# Patient Record
Sex: Female | Born: 1955 | Race: White | Hispanic: No | Marital: Married | Smoking: Former smoker
Health system: Southern US, Community
[De-identification: ages and names within clinical notes are randomized; demographics above are authoritative.]

## PROBLEM LIST (undated history)

## (undated) DIAGNOSIS — F32A Depression, unspecified: Secondary | ICD-10-CM

## (undated) DIAGNOSIS — F419 Anxiety disorder, unspecified: Secondary | ICD-10-CM

## (undated) DIAGNOSIS — I10 Essential (primary) hypertension: Secondary | ICD-10-CM

## (undated) DIAGNOSIS — E559 Vitamin D deficiency, unspecified: Secondary | ICD-10-CM

## (undated) DIAGNOSIS — R7989 Other specified abnormal findings of blood chemistry: Secondary | ICD-10-CM

## (undated) DIAGNOSIS — F329 Major depressive disorder, single episode, unspecified: Secondary | ICD-10-CM

## (undated) DIAGNOSIS — R945 Abnormal results of liver function studies: Secondary | ICD-10-CM

## (undated) DIAGNOSIS — E119 Type 2 diabetes mellitus without complications: Secondary | ICD-10-CM

## (undated) DIAGNOSIS — E785 Hyperlipidemia, unspecified: Secondary | ICD-10-CM

## (undated) DIAGNOSIS — D414 Neoplasm of uncertain behavior of bladder: Secondary | ICD-10-CM

## (undated) HISTORY — DX: Depression, unspecified: F32.A

## (undated) HISTORY — DX: Essential (primary) hypertension: I10

## (undated) HISTORY — DX: Type 2 diabetes mellitus without complications: E11.9

## (undated) HISTORY — DX: Vitamin D deficiency, unspecified: E55.9

## (undated) HISTORY — DX: Abnormal results of liver function studies: R94.5

## (undated) HISTORY — DX: Major depressive disorder, single episode, unspecified: F32.9

## (undated) HISTORY — DX: Anxiety disorder, unspecified: F41.9

## (undated) HISTORY — DX: Hyperlipidemia, unspecified: E78.5

## (undated) HISTORY — DX: Neoplasm of uncertain behavior of bladder: D41.4

## (undated) HISTORY — DX: Other specified abnormal findings of blood chemistry: R79.89

---

## 2000-12-09 ENCOUNTER — Other Ambulatory Visit: Admission: RE | Admit: 2000-12-09 | Discharge: 2000-12-09 | Payer: Self-pay | Admitting: Gynecology

## 2004-05-23 ENCOUNTER — Other Ambulatory Visit: Admission: RE | Admit: 2004-05-23 | Discharge: 2004-05-23 | Payer: Self-pay | Admitting: Gynecology

## 2007-05-31 ENCOUNTER — Other Ambulatory Visit: Admission: RE | Admit: 2007-05-31 | Discharge: 2007-05-31 | Payer: Self-pay | Admitting: Gynecology

## 2009-02-16 ENCOUNTER — Ambulatory Visit: Payer: Self-pay | Admitting: Gynecology

## 2009-02-16 ENCOUNTER — Other Ambulatory Visit: Admission: RE | Admit: 2009-02-16 | Discharge: 2009-02-16 | Payer: Self-pay | Admitting: Gynecology

## 2009-02-16 ENCOUNTER — Encounter: Payer: Self-pay | Admitting: Gynecology

## 2013-01-31 ENCOUNTER — Other Ambulatory Visit: Payer: Self-pay | Admitting: Family Medicine

## 2013-01-31 DIAGNOSIS — R7989 Other specified abnormal findings of blood chemistry: Secondary | ICD-10-CM

## 2013-02-10 ENCOUNTER — Other Ambulatory Visit: Payer: Self-pay

## 2013-02-17 ENCOUNTER — Ambulatory Visit
Admission: RE | Admit: 2013-02-17 | Discharge: 2013-02-17 | Disposition: A | Discharge status: Home / Self Care | Payer: 59 | Source: Ambulatory Visit | Attending: Family Medicine | Admitting: Family Medicine

## 2013-02-17 DIAGNOSIS — R7989 Other specified abnormal findings of blood chemistry: Secondary | ICD-10-CM

## 2015-01-06 ENCOUNTER — Encounter: Payer: Self-pay | Admitting: *Deleted

## 2015-02-13 IMAGING — US US ABDOMEN COMPLETE
1 series · 13 of 25 positions shown · non-contrast
Comparison: None.

CLINICAL DATA: Elevated liver function tests

COMPLETE ABDOMINAL ULTRASOUND

[Series 1: us abdomen complete · 0.35mm/px · 13 of 83 slices shown]
[im 1/83]
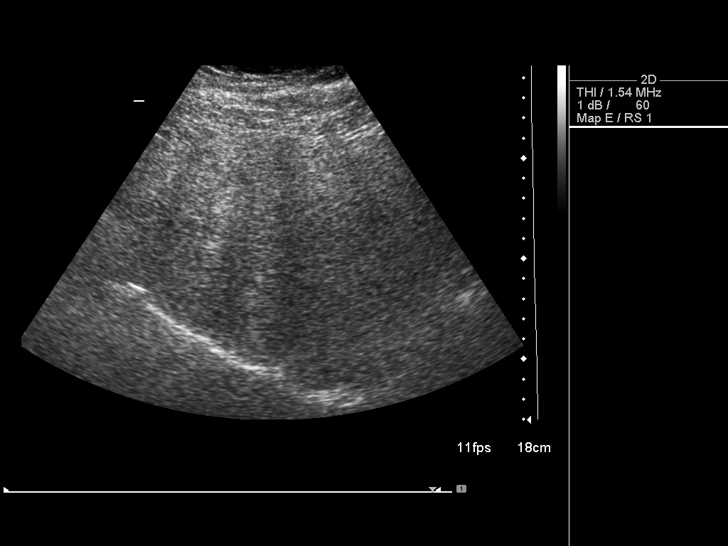
[im 7/83]
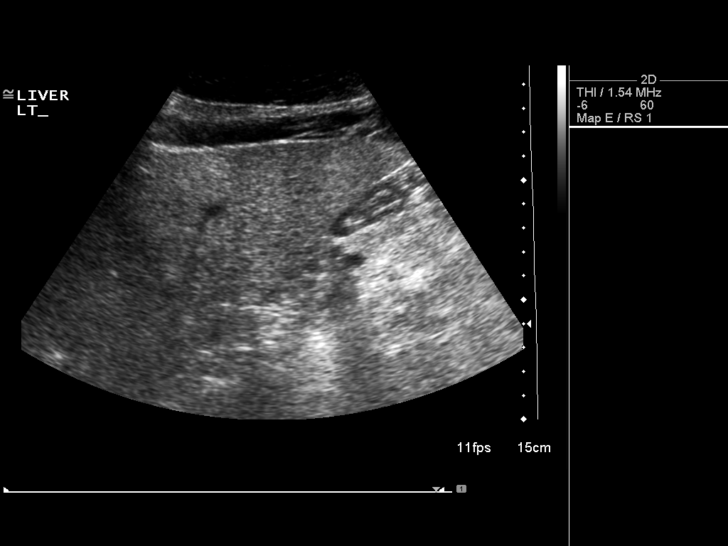
[im 14/83]
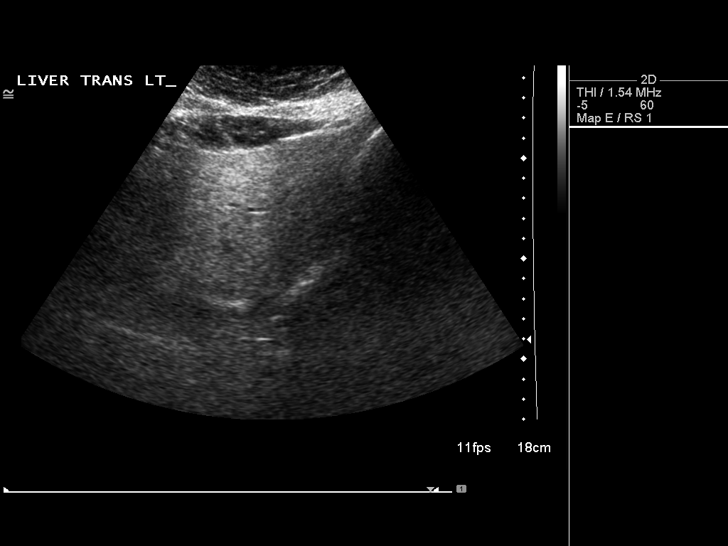
[im 21/83]
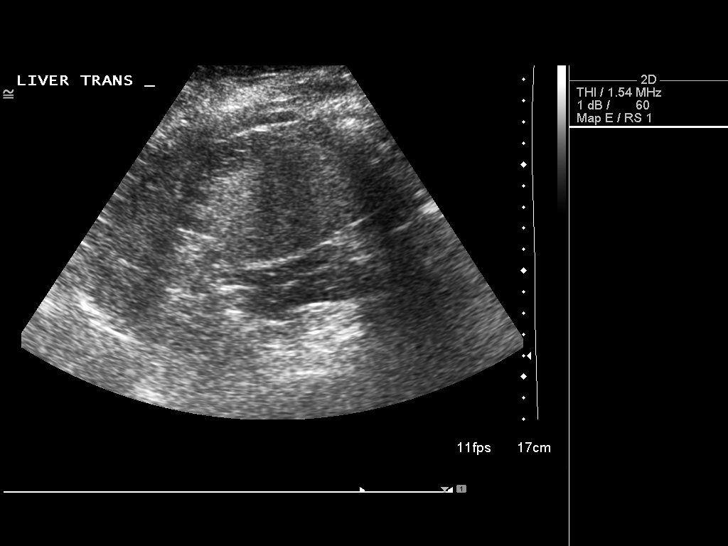
[im 28/83]
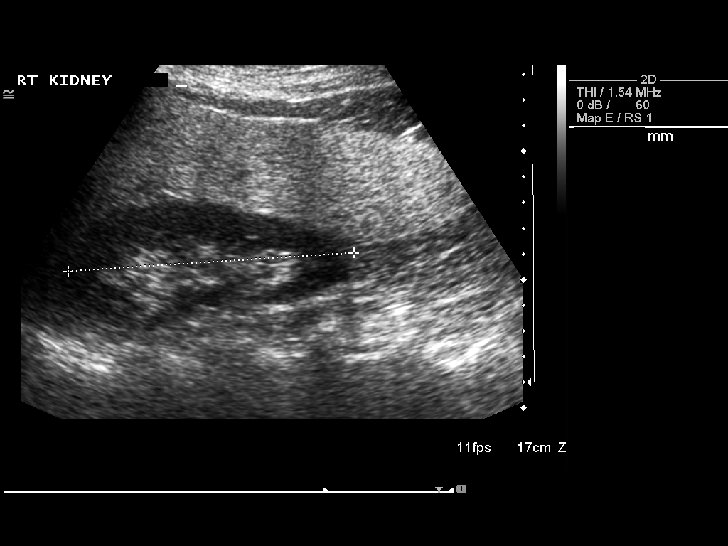
[im 35/83]
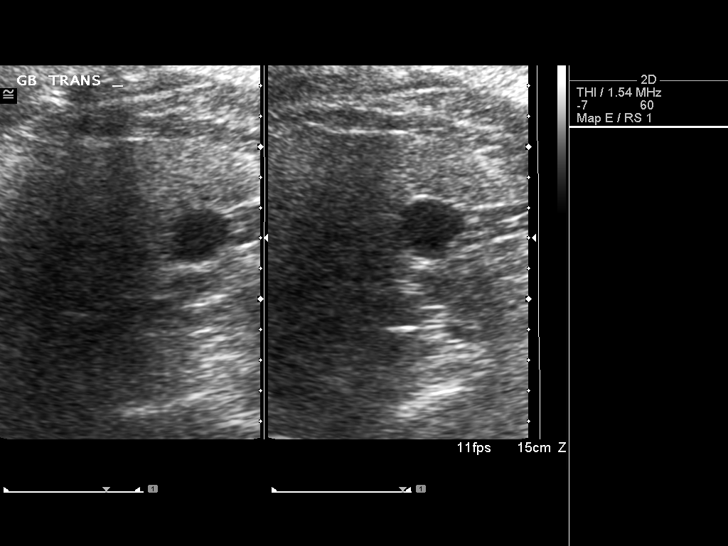
[im 42/83]
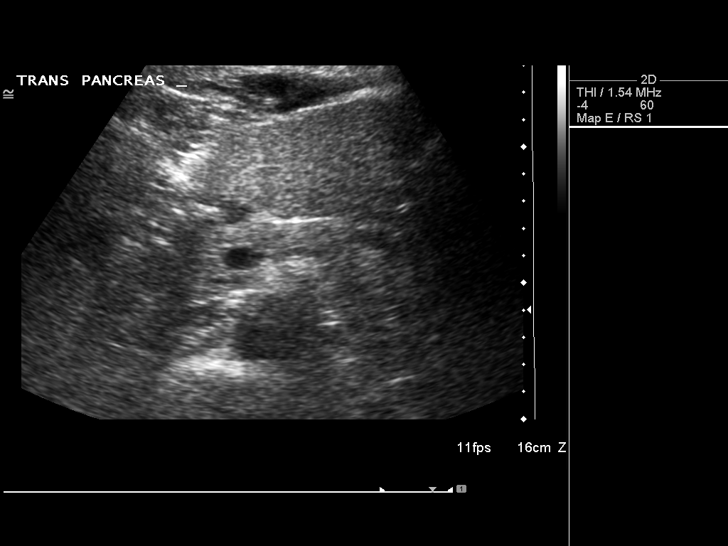
[im 48/83]
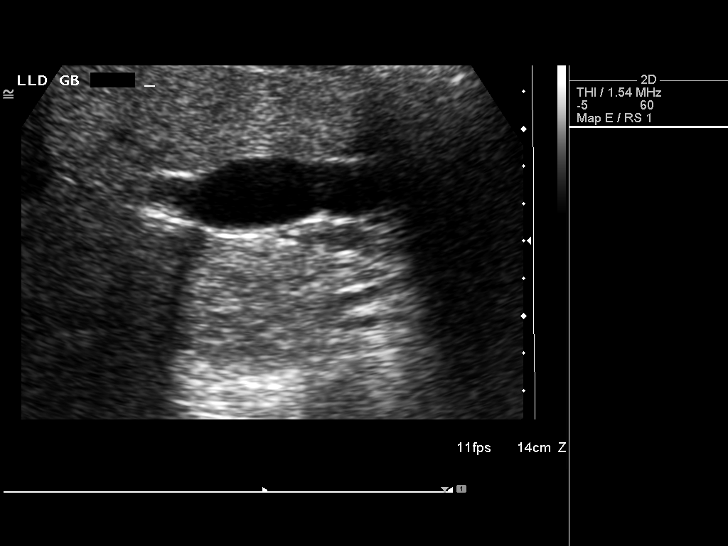
[im 55/83]
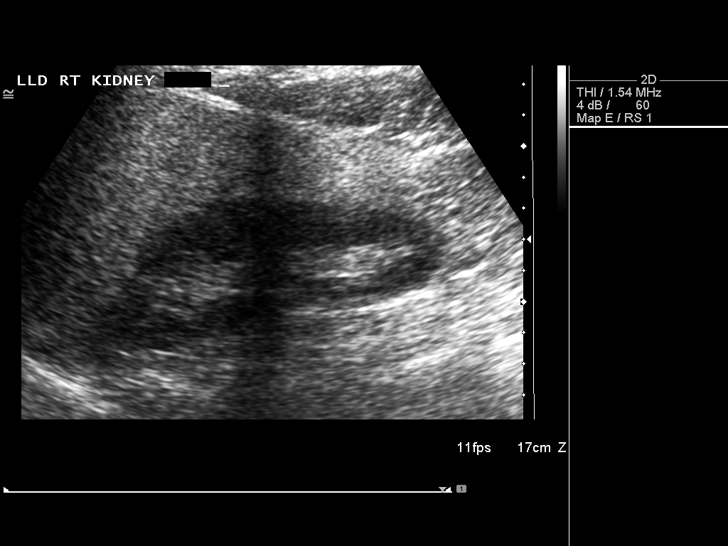
[im 62/83]
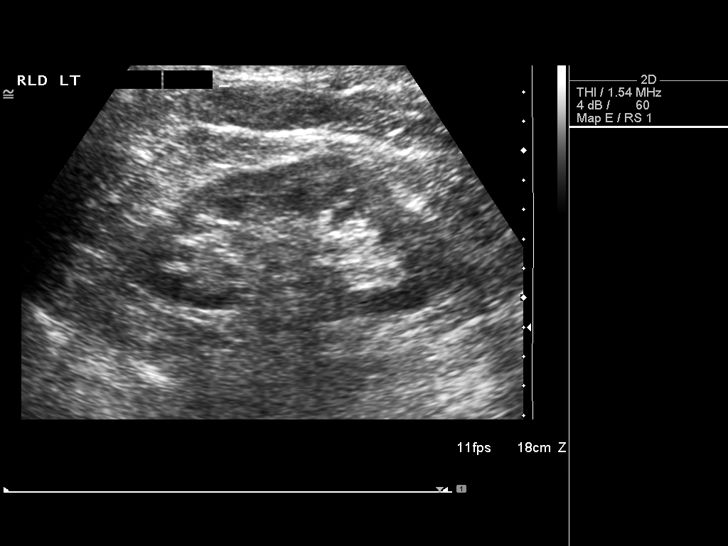
[im 69/83]
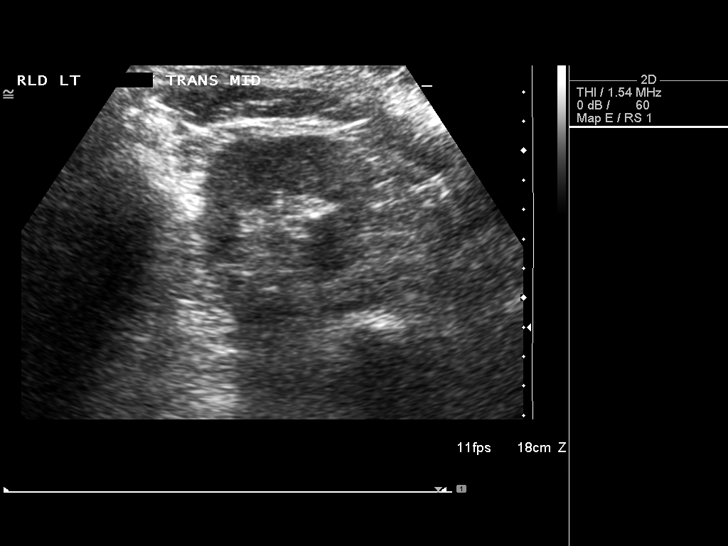
[im 76/83]
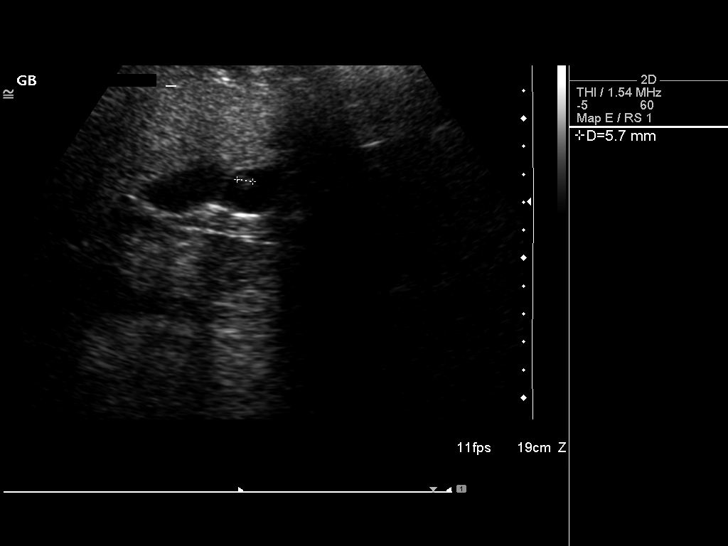
[im 83/83]
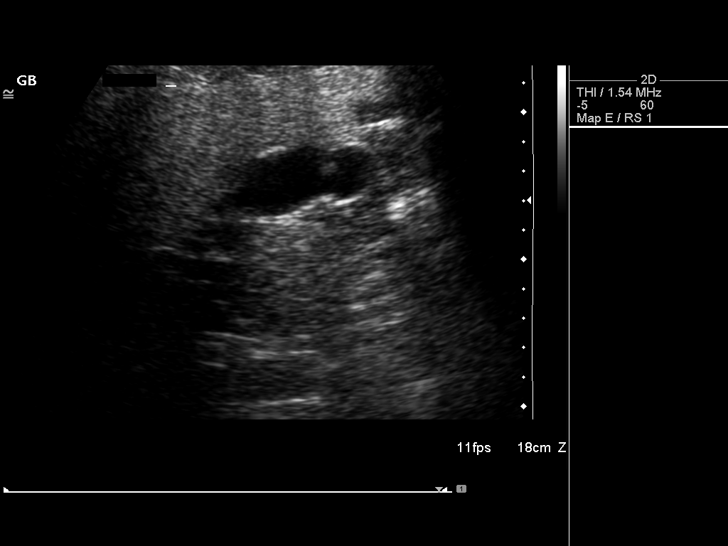

[13 of 25 positions shown; findings below may reference images not displayed]

FINDINGS: Gallbladder:  The gallbladder is slightly contracted and there are
a small polyps present of no more than 6 mm in diameter.  No
gallstones are seen.  There is slight tenderness over the right
upper quadrant with compression.

Common bile duct:  The common bile duct is within upper limits of
normal measuring 6.6 mm.

Liver:  The liver is echogenic inhomogeneous consistent with fatty
infiltration.  A probable Delfin lobe is noted extending
caudally.

IVC:  Appears normal.

Pancreas:  No focal abnormality seen.

Spleen:  The spleen is normal measuring 5.6 cm sagittally.

Right Kidney:  No hydronephrosis is seen.  The right kidney
measures 12.0 cm sagittally.

Left Kidney:  No hydronephrosis is noted.  The left kidney measures
12.0 cm.

Abdominal aorta:  The abdominal aorta is normal in caliber.

Portions of this exam are limited due to bowel gas.
IMPRESSION: 1.  Gallbladder polyps.  No definite gallstones.  However there is
tenderness over the gallbladder with compression and clinical
correlation is recommended.
2.  Slightly prominent common bile duct.
3.  Fatty infiltration of the liver.

## 2015-05-07 ENCOUNTER — Other Ambulatory Visit: Payer: Self-pay

## 2015-05-07 ENCOUNTER — Other Ambulatory Visit: Payer: Self-pay | Admitting: Family Medicine

## 2015-05-07 DIAGNOSIS — Z1231 Encounter for screening mammogram for malignant neoplasm of breast: Secondary | ICD-10-CM

## 2015-05-07 DIAGNOSIS — R0989 Other specified symptoms and signs involving the circulatory and respiratory systems: Secondary | ICD-10-CM

## 2015-05-10 ENCOUNTER — Ambulatory Visit
Admission: RE | Admit: 2015-05-10 | Discharge: 2015-05-10 | Disposition: A | Discharge status: Home / Self Care | Payer: 59 | Source: Ambulatory Visit

## 2015-05-10 ENCOUNTER — Ambulatory Visit
Admission: RE | Admit: 2015-05-10 | Discharge: 2015-05-10 | Disposition: A | Discharge status: Home / Self Care | Payer: 59 | Source: Ambulatory Visit | Attending: Family Medicine | Admitting: Family Medicine

## 2015-05-10 DIAGNOSIS — Z1231 Encounter for screening mammogram for malignant neoplasm of breast: Secondary | ICD-10-CM

## 2015-05-10 DIAGNOSIS — R0989 Other specified symptoms and signs involving the circulatory and respiratory systems: Secondary | ICD-10-CM

## 2015-05-14 ENCOUNTER — Other Ambulatory Visit: Payer: Self-pay | Admitting: Family Medicine

## 2015-05-14 DIAGNOSIS — R928 Other abnormal and inconclusive findings on diagnostic imaging of breast: Secondary | ICD-10-CM

## 2015-05-22 ENCOUNTER — Ambulatory Visit
Admission: RE | Admit: 2015-05-22 | Discharge: 2015-05-22 | Disposition: A | Discharge status: Home / Self Care | Payer: 59 | Source: Ambulatory Visit | Attending: Family Medicine | Admitting: Family Medicine

## 2015-05-22 DIAGNOSIS — R928 Other abnormal and inconclusive findings on diagnostic imaging of breast: Secondary | ICD-10-CM

## 2015-05-29 ENCOUNTER — Encounter: Payer: Self-pay | Admitting: *Deleted

## 2015-05-29 ENCOUNTER — Encounter: Payer: 59 | Attending: Family Medicine | Admitting: *Deleted

## 2015-05-29 VITALS — Ht 63.0 in | Wt 204.0 lb

## 2015-05-29 DIAGNOSIS — Z713 Dietary counseling and surveillance: Secondary | ICD-10-CM | POA: Insufficient documentation

## 2015-05-29 DIAGNOSIS — E119 Type 2 diabetes mellitus without complications: Secondary | ICD-10-CM | POA: Diagnosis present

## 2015-05-29 NOTE — Progress Notes (Signed)
Diabetes Self-Management Education  Visit Type: First/Initial (DX:04/2014)  Appt. Start Time: 0800 Appt. End Time: 0930  05/29/2015  [name], identified by name and date of birth, is a 59 y.o. female with a diagnosis of Diabetes: Type 2.  Other people present during visit:  Patient presents with HX of T2DM onset 04/2014 with an A1c of 8.1%. She has been attempting to manage with nutrition and activity and was able to reduce her A1c to 6.9%. Today we have an A1c of 10.9%. Her father, sister and husband also have diabetes. 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 If view of the complicates experienced by her father she is very motivated to manage her glucose. She is presently symptomatic with; polyuria, polydypsia, fatigue and impaired vision.  ASSESSMENT  Height 5\' 3"  (1.6 m), weight 204 lb (92.534 kg). Body mass index is 36.15 kg/(m^2).  Initial Visit Information:  Are you currently following a meal plan?: Yes What type of meal plan do you follow?: low carbohydrates Are you taking your medications as prescribed?: Yes  How often do you need to have someone help you when you read instructions, pamphlets, or other written materials from your doctor or pharmacy?: 1 - Never   Psychosocial:   Patient Belief/Attitude about Diabetes: Motivated to manage diabetes Self-care barriers: None Self-management support: Doctor's office, CDE visits Other persons present: Patient Patient Concerns: Nutrition/Meal planning, Monitoring, Healthy Lifestyle, Glycemic Control, Weight Control Special Needs: None Preferred Learning Style: No preference indicated Learning Readiness: Change in progress  Complications:   Last HgB A1C per patient/outside source: 10.9 mg/dL How often do you check your blood sugar?: 1-2 times/day Fasting Blood glucose range (mg/dL): >200 Postprandial Blood glucose range (mg/dL): >200 (305mg /dl 2hpp this date) Have you had a dilated eye exam  in the past 12 months?: No Have you had a dental exam in the past 12 months?: Yes  Exercise:  Exercise: ADL's  Individualized Plan for Diabetes Self-Management Training:   Learning Objective:  Patient will have a greater understanding of diabetes self-management. Patient education plan per assessed needs and concerns is to attend individual sessions     Education Topics Reviewed with Patient Today:  Definition of diabetes, type 1 and 2, and the diagnosis of diabetes, Factors that contribute to the development of diabetes Role of diet in the treatment of diabetes and the relationship between the three main macronutrients and blood glucose level, Food label reading, portion sizes and measuring food., Carbohydrate counting, Reviewed blood glucose goals for pre and post meals and how to evaluate the patients' food intake on their blood glucose level., Effects of alcohol on blood glucose and safety factors with consumption of alcohol., Meal options for control of blood glucose level and chronic complications. Role of exercise on diabetes management, blood pressure control and cardiac health., Helped patient identify appropriate exercises in relation to his/her diabetes, diabetes complications and other health issue. Reviewed patients medication for diabetes, action, purpose, timing of dose and side effects. Purpose and frequency of SMBG. Relationship between chronic complications and blood glucose control, Assessed and discussed foot care and prevention of foot problems, Dental care, Lipid levels, blood glucose control and heart disease, Retinopathy and reason for yearly dilated eye exams, Identified and discussed with patient  current chronic complications, Reviewed with patient heart disease, higher risk of, and prevention Role of stress on diabetes   PATIENTS GOALS/Plan (Developed by the patient):  Nutrition: General guidelines for healthy choices and portions discussed Physical  Activity:  Exercise 5-7 days per week, 30 minutes per day Medications: take my medication as prescribed Monitoring : test my blood glucose as discussed (note x per day with comment) (FBS & 2hpp any meal) Reducing Risk: do foot checks daily, get labs drawn   Patient Instructions  Plan:  Aim for 2-3 Carb Choices per meal (30-45 grams) +/- 1 either way  Aim for 0-15 Carbs per snack if hungry  Include protein in moderation with your meals and snacks Consider reading food labels for Total Carbohydrate and Fat Grams of foods Consider  increasing your activity level by walking for 30 minutes daily as tolerated Consider checking BG at alternate times per day to include fasting ( before eating or drinking anything in the morning & 2 hours after any meal) as directed by MD  Continue taking medication as directed by MD  Jenny Maldonado & [employer] reduced calore 29/51 calories per slice ( 8-84Z) [employer] [place] Yogurt Yoplait 100 Greek Yogurt [employer] 000 (black)  [facility] [employer]  Consider [employer] Glucometer and testing products for lesser cost!    Expected Outcomes:  Demonstrated interest in learning. Expect positive outcomes  Education material provided: Living Well with Diabetes, Food label handouts, A1C conversion sheet, Meal plan card, My Plate and Snack sheet  If problems or questions, patient to contact team via:  Phone  Future DSME appointment: 4-6 wks

## 2015-05-29 NOTE — Patient Instructions (Signed)
Plan:  Aim for 2-3 Carb Choices per meal (30-45 grams) +/- 1 either way  Aim for 0-15 Carbs per snack if hungry  Include protein in moderation with your meals and snacks Consider reading food labels for Total Carbohydrate and Fat Grams of foods Consider  increasing your activity level by walking for 30 minutes daily as tolerated Consider checking BG at alternate times per day to include fasting ( before eating or drinking anything in the morning & 2 hours after any meal) as directed by MD  Continue taking medication as directed by MD  [name] & [employer] reduced calore 33/38 calories per slice ( 3-29V) [employer] Fit [place] Yogurt Yoplait 100 Greek Yogurt [employer] 000 (black)  [facility] [employer] and testing products for lesser cost!

## 2015-07-10 ENCOUNTER — Telehealth: Payer: Self-pay | Admitting: *Deleted

## 2015-07-10 ENCOUNTER — Encounter: Payer: Self-pay | Admitting: *Deleted

## 2015-07-10 ENCOUNTER — Encounter: Payer: 59 | Attending: Family Medicine | Admitting: *Deleted

## 2015-07-10 VITALS — Ht 63.0 in | Wt 201.0 lb

## 2015-07-10 DIAGNOSIS — Z713 Dietary counseling and surveillance: Secondary | ICD-10-CM | POA: Diagnosis not present

## 2015-07-10 DIAGNOSIS — E119 Type 2 diabetes mellitus without complications: Secondary | ICD-10-CM | POA: Insufficient documentation

## 2015-07-10 NOTE — Patient Instructions (Signed)
Continue making the good food choices Continue exercising portion control Work some exercise into your daily activity. Goal of 150 mniutes per week (30 minutes 5Xweek) Gradually decrease wine to 5 oz per night. Set a specific bed time and try to stick to it  Follow up with Dr. Drema Dallas with Metformin, Lab tests for August and f/u appointment

## 2015-07-10 NOTE — Progress Notes (Signed)
Diabetes Self-Management Education  Visit Type:  Follow-up  Appt. Start Time: 0900 Appt. End Time: 1000  07/10/2015  [name], identified by name and date of birth, is a 59 y.o. female with a diagnosis of Diabetes: Type 2.  Other people present during visit:  Patient only. Jenny Maldonado is making good food choices and exercising portion control. Her glucose number have come down but she continues to have opportunity for improvement. She had been instructed by Dr. Drema Dallas to call her with glucose numbers but had not done so yet. I contacted office and left msg with FBS & 2hpp ranges as well as 7d-14d- & 30d averages. Questioned if it would be appropriate to increase Metformin at this time. Patient contact information provided for call back. Patient has only lost 3# in the past 6 weeks. This is frustrating to her. She admits that she has not embraced the exercise. She has a treadmill but has not gotten on it. She is presently drinking 1-3 glasses of red wine per evening and not getting enough sleep. She recognizes this as a challenge.  ASSESSMENT  Height 5\' 3"  (1.6 m), weight 201 lb (91.173 kg). Body mass index is 35.61 kg/(m^2).   Subsequent Visit Information:  Since your last visit, have you continued or began the use of a meal plan?: Yes Since your last visit, have you continued or began to exercise on a consistent basis?: No Since your last visit have you continued or begun to take your medications as prescribed?: Yes Since your last visit have you had your blood pressure checked?: Yes Is your most recent blood pressure lower, unchanged, or higher since your last visit?: Higher (monitors regularly, on medication for management) Since your last visit have you experienced any weight changes?: Loss Weight Loss (lbs): 3 Since your last visit, are you checking your blood glucose at least once a day?: Yes  Psychosocial:   Patient Belief/Attitude about Diabetes: Motivated to manage  diabetes Self-care barriers: None Self-management support: Jenny Maldonado office, Family, CDE visits Other persons present: Patient Patient Concerns: Healthy Lifestyle, Weight Control, Glycemic Control Special Needs: None Preferred Learning Style: No preference indicated Learning Readiness: Change in progress  Complications:   How often do you check your blood sugar?: 1-2 times/day Fasting Blood glucose range (mg/dL): 130-179, >200 (154-210 mg/dl) Postprandial Blood glucose range (mg/dL): 70-129, >200 (118-228mg /dl) Have you had a dilated eye exam in the past 12 months?: Yes  Diet Intake:  Breakfast: cottage cheese, blueberries, hot green tea, / egg, 2 bacon, 1 bread, 4 oz v8 juice/ Yoplait 100 Greek , coffee Lunch: salad, romaine lettuce, tomatoe, cucumber, olive, (oil & vinegar) ----add carb & protein           tuna salad /  Beverage(s): No longer drinking Diet Coke, now drinks water and ice tea,   Exercise:  Exercise: ADL's  Individualized Plan for Diabetes Self-Management Training:   Learning Objective:  Patient will have a greater understanding of diabetes self-management. Patient education plan per assessed needs and concerns is to attend individual sessions    Education Topics Reviewed with Patient Today:   Relationship between chronic complications and blood glucose control, Lipid levels, blood glucose control and heart disease Role of stress on diabetes, Worked with patient to identify barriers to care and solutions Lifestyle issues that need to be addressed for better diabetes care  PATIENTS GOALS/Plan (Developed by the patient):  Nutrition: General guidelines for healthy choices and portions discussed Physical Activity: Exercise 3-5 times per week  Medications: take my medication as prescribed  Patient Self Evaluation of Goals - Patient rates self as meeting previously set goals:   Nutrition: >75% Physical Activity: < 25% Medications: >75% Monitoring: >75% Problem  Solving: >75%  Patient Instructions  Continue making the good food choices Continue exercising portion control Work some exercise into your daily activity. Goal of 150 mniutes per week (30 minutes 5Xweek) Gradually decrease wine to 5 oz per night. Set a specific bed time and try to stick to it  Follow up with Dr. Drema Dallas with Metformin, Lab tests for August and f/u appointment   Expected Outcomes:  Demonstrated interest in learning. Expect positive outcomes  If problems or questions, patient to contact team via:  Phone  Future DSME appointment: - PRN

## 2015-07-10 NOTE — Telephone Encounter (Signed)
Left Message - Left msg with current glucose readings for further evaluation r/t increase of metformin.   By Bernie Covey, RN

## 2017-01-01 DIAGNOSIS — Z23 Encounter for immunization: Secondary | ICD-10-CM | POA: Diagnosis not present

## 2017-02-06 DIAGNOSIS — E119 Type 2 diabetes mellitus without complications: Secondary | ICD-10-CM | POA: Diagnosis not present

## 2017-02-06 DIAGNOSIS — E782 Mixed hyperlipidemia: Secondary | ICD-10-CM | POA: Diagnosis not present

## 2017-02-06 DIAGNOSIS — E559 Vitamin D deficiency, unspecified: Secondary | ICD-10-CM | POA: Diagnosis not present

## 2017-02-10 DIAGNOSIS — E1165 Type 2 diabetes mellitus with hyperglycemia: Secondary | ICD-10-CM | POA: Diagnosis not present

## 2017-02-10 DIAGNOSIS — I10 Essential (primary) hypertension: Secondary | ICD-10-CM | POA: Diagnosis not present

## 2017-02-10 DIAGNOSIS — E782 Mixed hyperlipidemia: Secondary | ICD-10-CM | POA: Diagnosis not present

## 2017-05-11 DIAGNOSIS — E119 Type 2 diabetes mellitus without complications: Secondary | ICD-10-CM | POA: Diagnosis not present

## 2017-05-11 DIAGNOSIS — I10 Essential (primary) hypertension: Secondary | ICD-10-CM | POA: Diagnosis not present

## 2017-05-11 DIAGNOSIS — E559 Vitamin D deficiency, unspecified: Secondary | ICD-10-CM | POA: Diagnosis not present

## 2017-05-11 DIAGNOSIS — E782 Mixed hyperlipidemia: Secondary | ICD-10-CM | POA: Diagnosis not present

## 2017-12-11 DIAGNOSIS — Z23 Encounter for immunization: Secondary | ICD-10-CM | POA: Diagnosis not present

## 2018-03-23 DIAGNOSIS — E559 Vitamin D deficiency, unspecified: Secondary | ICD-10-CM | POA: Diagnosis not present

## 2018-03-23 DIAGNOSIS — E782 Mixed hyperlipidemia: Secondary | ICD-10-CM | POA: Diagnosis not present

## 2018-03-23 DIAGNOSIS — E119 Type 2 diabetes mellitus without complications: Secondary | ICD-10-CM | POA: Diagnosis not present

## 2018-03-29 DIAGNOSIS — E782 Mixed hyperlipidemia: Secondary | ICD-10-CM | POA: Diagnosis not present

## 2018-03-29 DIAGNOSIS — I10 Essential (primary) hypertension: Secondary | ICD-10-CM | POA: Diagnosis not present

## 2018-11-10 DIAGNOSIS — E782 Mixed hyperlipidemia: Secondary | ICD-10-CM | POA: Diagnosis not present

## 2018-11-10 DIAGNOSIS — E1165 Type 2 diabetes mellitus with hyperglycemia: Secondary | ICD-10-CM | POA: Diagnosis not present

## 2018-11-10 DIAGNOSIS — E559 Vitamin D deficiency, unspecified: Secondary | ICD-10-CM | POA: Diagnosis not present

## 2018-11-12 DIAGNOSIS — E559 Vitamin D deficiency, unspecified: Secondary | ICD-10-CM | POA: Diagnosis not present

## 2018-11-12 DIAGNOSIS — I10 Essential (primary) hypertension: Secondary | ICD-10-CM | POA: Diagnosis not present

## 2018-11-12 DIAGNOSIS — E782 Mixed hyperlipidemia: Secondary | ICD-10-CM | POA: Diagnosis not present

## 2018-11-12 DIAGNOSIS — Z23 Encounter for immunization: Secondary | ICD-10-CM | POA: Diagnosis not present

## 2019-11-09 ENCOUNTER — Ambulatory Visit: Payer: 59 | Admitting: *Deleted

## 2019-12-01 ENCOUNTER — Encounter: Payer: 59 | Attending: Family Medicine | Admitting: Registered"

## 2019-12-01 ENCOUNTER — Encounter: Payer: Self-pay | Admitting: Registered"

## 2019-12-01 ENCOUNTER — Other Ambulatory Visit: Payer: Self-pay

## 2019-12-01 DIAGNOSIS — E1165 Type 2 diabetes mellitus with hyperglycemia: Secondary | ICD-10-CM | POA: Insufficient documentation

## 2019-12-01 NOTE — Progress Notes (Signed)
Diabetes Self-Management Education  Visit Type: First/Initial  Appt. Start Time: 0945 Appt. End Time: 1110  12/01/2019  [name], identified by name and date of birth, is a 63 y.o. female with a diagnosis of Diabetes: Type 2.   ASSESSMENT  There were no vitals taken for this visit. There is no height or weight on file to calculate BMI.   Patient states she is here because she is concerned about her A1c going up again (7.3% 09/02/2019 lab with referral paperwork). Patient doesn't want to take more medication. Pt states she would also like to lose weight. Pt reports she is at her highest weight (~200 lbs).  SMBG: only checks FBG; usually 140-170 mg/dL; 203 mg/dL this morning.   Medication: metformin 1000 mg BID, 1-2x/week forgets to take 1 dose when planning to take with lunch. Pt reports her husband also has T2DM and suggested she try metformin XR which may be a good option for her.  Diet: limited time for cooking. Some typical foods in diet recall are high in sodium, may be contributing to swelling in feet and hands. Salt craving may be partially due to dehydration. Portions: Husband shares in cooking, dishes up more food than patient wants to eat, has "clean your plate" habit from childhood.  Stress: 8/10; mostly work related. Pt reports early on in the pandemic there was additional financial stress in household. Pt reports works 7 days/wk for 31 years, likes it because of flexible schedule. Pt is not required to work that much but states she has to accept that much work to earn enough money.  Sleep: 5 hrs per night, is usually up late working  Social support: pt has good relationship with sisters who live in the area.  Pt had question regarding hair loss and biotin. RD discussed effect biotin can have on labs (false readings for cardiac tests).  Diabetes Self-Management Education - 12/01/19 1004      Visit Information   Visit Type  First/Initial      Initial Visit    Diabetes Type  Type 2    Are you currently following a meal plan?  No    Are you taking your medications as prescribed?  Yes   metformin   Date Diagnosed  2015      Health Coping   How would you rate your overall health?  Good      Psychosocial Assessment   Patient Belief/Attitude about Diabetes  Motivated to manage diabetes    How often do you need to have someone help you when you read instructions, pamphlets, or other written materials from your doctor or pharmacy?  1 - Never    What is the last grade level you completed in school?  4 yrs college      Complications   Last HgB A1C per patient/outside source  7.2 %    How often do you check your blood sugar?  3-4 times / week    Fasting Blood glucose range (mg/dL)  130-179    Number of hypoglycemic episodes per month  0    Have you had a dilated eye exam in the past 12 months?  Yes    Have you had a dental exam in the past 12 months?  Yes    Are you checking your feet?  Yes    How many days per week are you checking your feet?  4      Dietary Intake   Breakfast  coffee, maybe V8 low  sodium with breakfast: 80 cal [place] OR cottage cheese, fruit OR sausage egg sandwich, 1/2 bread OR scrambled eggs, sausage    Snack (morning)  none    Lunch  low sodium tomato soup, saltines, cheese or chicken salald OR sandwich, cheese & mustard OR salad    Snack (afternoon)  none    Dinner  meat, vegetables, or potato OR burger or chicken sandwich, chili OR zackby's salad    Snack (evening)  popcorn, crackers and cheese or PB, olives or dill pickles    Beverage(s)  coffee 1/2 & 1/2, not enough water, low sodium V8, iced tea with lemon, wine occassion when going for dinner sometimes, diet coke a lot      Exercise   Exercise Type  ADL's    How many days per week to you exercise?  0    How many minutes per day do you exercise?  0    Total minutes per week of exercise  0      Patient Education   Previous Diabetes Education  Yes (please comment)    2016 [facility]   Disease state   Other (comment)   described progressive nature of DM and continued care importance   Nutrition management   Role of diet in the treatment of diabetes and the relationship between the three main macronutrients and blood glucose level    Medications  Reviewed patients medication for diabetes, action, purpose, timing of dose and side effects.      Individualized Goals (developed by patient)   Nutrition  General guidelines for healthy choices and portions discussed    Physical Activity  Exercise 3-5 times per week    Medications  take my medication as prescribed      Outcomes   Expected Outcomes  Demonstrated interest in learning. Expect positive outcomes    Future DMSE  3-4 months    Program Status  Completed       Individualized Plan for Diabetes Self-Management Training:   Learning Objective:  Patient will have a greater understanding of diabetes self-management. Patient education plan is to attend individual and/or group sessions per assessed needs and concerns.   Patient Instructions  Look for Youtube exercise video Low impact, high intensity workout 20 min 3-4x/week Listen to podcast while on treadmill Consider eating out less to get less sodium and drinking more water With medication in the morning drink a full bottle of water. Keep water at your desk to remind you to drink. Water at lunch. Look for another option for lunch, look for the pre-made salad. Edamame pasta is a high protein option   Expected Outcomes:  Demonstrated interest in learning. Expect positive outcomes  Education material provided: A1C conversion sheet, Sleep Hygiene, MyPlate  If problems or questions, patient to contact team via:  Phone and MyChart  Future DSME appointment: 3-4 months

## 2019-12-01 NOTE — Patient Instructions (Addendum)
Look for Youtube exercise video Low impact, high intensity workout 20 min 3-4x/week Listen to podcast while on treadmill Consider eating out less to get less sodium and drinking more water With medication in the morning drink a full bottle of water. Keep water at your desk to remind you to drink. Water at lunch. Look for another option for lunch, look for the pre-made salad. Edamame pasta is a high protein option

## 2019-12-02 ENCOUNTER — Other Ambulatory Visit: Payer: Self-pay | Admitting: Family Medicine

## 2019-12-02 DIAGNOSIS — N63 Unspecified lump in unspecified breast: Secondary | ICD-10-CM

## 2020-03-30 ENCOUNTER — Other Ambulatory Visit: Payer: Self-pay | Admitting: Family Medicine

## 2020-03-30 DIAGNOSIS — K824 Cholesterolosis of gallbladder: Secondary | ICD-10-CM

## 2020-05-22 ENCOUNTER — Ambulatory Visit
Admission: RE | Admit: 2020-05-22 | Discharge: 2020-05-22 | Disposition: A | Discharge status: Home / Self Care | Payer: 59 | Source: Ambulatory Visit | Attending: Family Medicine | Admitting: Family Medicine

## 2020-05-22 DIAGNOSIS — K824 Cholesterolosis of gallbladder: Secondary | ICD-10-CM

## 2020-06-04 ENCOUNTER — Ambulatory Visit: Payer: 59 | Admitting: Registered"

## 2020-07-09 ENCOUNTER — Encounter: Payer: Self-pay | Admitting: Registered"

## 2020-07-09 ENCOUNTER — Other Ambulatory Visit: Payer: Self-pay

## 2020-07-09 ENCOUNTER — Encounter: Payer: 59 | Attending: Family Medicine | Admitting: Registered"

## 2020-07-09 DIAGNOSIS — E1165 Type 2 diabetes mellitus with hyperglycemia: Secondary | ICD-10-CM | POA: Insufficient documentation

## 2020-07-09 NOTE — Progress Notes (Signed)
Diabetes Self-Management Education  Visit Type: Follow-up  Appt. Start Time: 1030 Appt. End Time: 1100   07/09/2020  [name], identified by name and date of birth, is a 64 y.o. female with a diagnosis of Diabetes:  .   ASSESSMENT  There were no vitals taken for this visit. There is no height or weight on file to calculate BMI.   Pt states she started glimepiride 3 weeks ago and first day had low blood sugar symptoms. Pt states when she is home and stay on a meal schedule doesn't have an issue, but if at work and has to work through lunch or when traveling and misses a meal may have hypoglycemic symptoms including shaking and has happened ~6x in last 3 weeks. Pt usually does not have her testing supplies with her when the symptoms hit so doesn't know what her CBG is at the time.  Pt states she has been reducing bread intake, not eating pasta, having small amounts of potatoes. Pt reports she has been using cauliflower substitutes at some meals. Pt states she has also increased water intake to 2-3 bottles from not drinking much at all before.   Pt reports she has not increased exercise and feels it is important. Pt reports she gets bored while walking but can listen to podcasts to help motivate her.   Diabetes Self-Management Education - 07/09/20 1042      Visit Information   Visit Type Follow-up      Exercise   Exercise Type ADL's      Patient Education   Physical activity and exercise  Role of exercise on diabetes management, blood pressure control and cardiac health.    Medications Reviewed patients medication for diabetes, action, purpose, timing of dose and side effects.      Individualized Goals (developed by patient)   Physical Activity Exercise 3-5 times per week    Medications take my medication as prescribed      Outcomes   Expected Outcomes Demonstrated interest in learning. Expect positive outcomes    Future DMSE 6 months    Program Status Not Completed       Subsequent Visit   Since your last visit have you continued or begun to take your medications as prescribed? Yes   metformin glimepiride          Individualized Plan for Diabetes Self-Management Training:   Learning Objective:  Patient will have a greater understanding of diabetes self-management. Patient education plan is to attend individual and/or group sessions per assessed needs and concerns.    Patient Instructions  Consider trying zoodles Continue watching carb intake Increase exercise by putting it on your schedule, get your podcast ready.  Consider reading the book Practicing Mindfulness: 75 essential meditation   Expected Outcomes:  Demonstrated interest in learning. Expect positive outcomes  Education material provided: none  If problems or questions, patient to contact team via:  Phone and MyChart  Future DSME appointment: 6 months

## 2020-07-09 NOTE — Patient Instructions (Addendum)
Consider trying zoodles Continue watching carb intake Increase exercise by putting it on your schedule, get your podcast ready.  Consider reading the book Practicing Mindfulness: 75 essential meditation

## 2021-01-11 ENCOUNTER — Ambulatory Visit: Payer: 59 | Admitting: Registered"

## 2024-02-01 ENCOUNTER — Other Ambulatory Visit: Payer: Self-pay | Admitting: Family Medicine

## 2024-02-01 DIAGNOSIS — Z780 Asymptomatic menopausal state: Secondary | ICD-10-CM

## 2024-02-01 DIAGNOSIS — Z0000 Encounter for general adult medical examination without abnormal findings: Secondary | ICD-10-CM

## 2024-02-08 ENCOUNTER — Other Ambulatory Visit: Payer: Self-pay | Admitting: Family Medicine

## 2024-02-08 DIAGNOSIS — Z0000 Encounter for general adult medical examination without abnormal findings: Secondary | ICD-10-CM

## 2024-02-08 DIAGNOSIS — N6489 Other specified disorders of breast: Secondary | ICD-10-CM

## 2024-03-11 ENCOUNTER — Other Ambulatory Visit: Payer: 59

## 2024-03-28 ENCOUNTER — Other Ambulatory Visit: Payer: Self-pay | Admitting: Family Medicine

## 2024-03-28 DIAGNOSIS — Z1231 Encounter for screening mammogram for malignant neoplasm of breast: Secondary | ICD-10-CM

## 2024-04-01 ENCOUNTER — Ambulatory Visit
Admission: RE | Admit: 2024-04-01 | Discharge: 2024-04-01 | Disposition: A | Discharge status: Home / Self Care | Payer: Self-pay | Source: Ambulatory Visit | Attending: Family Medicine | Admitting: Family Medicine

## 2024-04-01 ENCOUNTER — Other Ambulatory Visit: Payer: 59

## 2024-04-01 DIAGNOSIS — Z1231 Encounter for screening mammogram for malignant neoplasm of breast: Secondary | ICD-10-CM

## 2024-04-07 ENCOUNTER — Other Ambulatory Visit: Payer: Self-pay | Admitting: Family Medicine

## 2024-04-07 DIAGNOSIS — R928 Other abnormal and inconclusive findings on diagnostic imaging of breast: Secondary | ICD-10-CM

## 2024-04-19 ENCOUNTER — Encounter

## 2024-04-19 ENCOUNTER — Ambulatory Visit
Admission: RE | Admit: 2024-04-19 | Discharge: 2024-04-19 | Disposition: A | Discharge status: Home / Self Care | Source: Ambulatory Visit | Attending: Family Medicine | Admitting: Family Medicine

## 2024-04-19 ENCOUNTER — Other Ambulatory Visit: Payer: Self-pay | Admitting: Family Medicine

## 2024-04-19 ENCOUNTER — Other Ambulatory Visit

## 2024-04-19 DIAGNOSIS — R928 Other abnormal and inconclusive findings on diagnostic imaging of breast: Secondary | ICD-10-CM

## 2024-04-19 DIAGNOSIS — N6489 Other specified disorders of breast: Secondary | ICD-10-CM

## 2024-05-06 ENCOUNTER — Ambulatory Visit
Admission: RE | Admit: 2024-05-06 | Discharge: 2024-05-06 | Disposition: A | Discharge status: Home / Self Care | Source: Ambulatory Visit | Attending: Family Medicine | Admitting: Family Medicine

## 2024-05-06 DIAGNOSIS — N6489 Other specified disorders of breast: Secondary | ICD-10-CM

## 2024-05-06 HISTORY — PX: BREAST BIOPSY: SHX20

## 2024-05-09 LAB — SURGICAL PATHOLOGY

## 2024-06-02 ENCOUNTER — Ambulatory Visit: Payer: Self-pay | Admitting: General Surgery

## 2024-06-02 DIAGNOSIS — N6091 Unspecified benign mammary dysplasia of right breast: Secondary | ICD-10-CM

## 2024-06-09 ENCOUNTER — Other Ambulatory Visit: Payer: Self-pay | Admitting: General Surgery

## 2024-06-09 DIAGNOSIS — N6091 Unspecified benign mammary dysplasia of right breast: Secondary | ICD-10-CM

## 2024-06-24 ENCOUNTER — Encounter: Payer: Self-pay | Admitting: Hematology

## 2024-06-24 ENCOUNTER — Inpatient Hospital Stay: Attending: Hematology | Admitting: Hematology

## 2024-06-24 VITALS — BP 128/76 | HR 108 | Temp 97.7°F | Resp 19 | Wt 212.6 lb

## 2024-06-24 DIAGNOSIS — N6091 Unspecified benign mammary dysplasia of right breast: Secondary | ICD-10-CM | POA: Diagnosis not present

## 2024-06-24 DIAGNOSIS — Z87891 Personal history of nicotine dependence: Secondary | ICD-10-CM | POA: Diagnosis not present

## 2024-06-24 DIAGNOSIS — D0511 Intraductal carcinoma in situ of right breast: Secondary | ICD-10-CM | POA: Insufficient documentation

## 2024-06-24 NOTE — Progress Notes (Signed)
 [facility]   [phone] [phone]   Clinic New Consult Note   Patient Care Team: Gwenn Norris, MD (Inactive) as PCP - General (Family Medicine) 06/24/2024  CHIEF COMPLAINTS/PURPOSE OF CONSULTATION:  Right breast ADH  REFERRING PHYSICIAN: Dr. Curvin  Discussed the use of AI scribe software for clinical note transcription with the patient, who gave verbal consent to proceed.  History of Present Illness Jenny Maldonado is a 68 year old female with atypical ductal hyperplasia who presents for breast cancer risk management. She was referred by Dr. Curvin for evaluation of her breast ADH and future breast cancer risk management.  In April 2025, a routine mammogram after a 9-year gap led to further diagnostic imaging due to high breast density. A diagnostic mammogram and ultrasound identified a 1.9 cm area at the three o'clock position, 12 cm from the nipple in the right breast. A biopsy on May 13, 2024, confirmed atypical ductal hyperplasia (ADH).  She has diabetes, hypertension, and hyperlipidemia, managed with metformin, lisinopril, hydrochlorothiazide, and glimepiride. Her diabetes is poorly controlled with a recent A1c of 7.5. She uses Lexapro for anxiety and occasionally Lasix for foot swelling, though it is ineffective due to job constraints.  Family history includes melanoma in her maternal grandmother and recent cancer findings in her mother's lymph nodes and lungs. Her father had bladder and lung cancer, related to smoking. There is no family history of breast cancer.  She consumes minimal alcohol currently and quit smoking over thirty years ago. She is married, has one child, and works as a Writer, leading to a sedentary lifestyle.     MEDICAL HISTORY:  Past Medical History:  Diagnosis Date   Anxiety    Bladder polyps    Depression    Diabetes mellitus without complication (HCC)    Elevated liver function tests    Hyperlipidemia     Hypertension    Vitamin D  deficiency     SURGICAL HISTORY: Past Surgical History:  Procedure Laterality Date   BREAST BIOPSY Right 05/06/2024   US  RT BREAST BX W LOC DEV 1ST LESION IMG BX SPEC US  GUIDE 05/06/2024 [facility] MAMMOGR[facility]    SOCIAL HISTORY: Social History   Socioeconomic History   Marital status: Married    Spouse name: Not on file   Number of children: 1   Years of education: Not on file   Highest education level: Not on file  Occupational History   Not on file  Tobacco Use   Smoking status: Former    Current packs/day: 0.00    Types: Cigarettes    Quit date: 12/29/1990    Years since quitting: 33.5   Smokeless tobacco: Not on file  Substance and Sexual Activity   Alcohol use: Yes    Comment: she used to drink wine daily, now socal drinker   Drug use: No   Sexual activity: Not on file  Other Topics Concern   Not on file  Social History Narrative   Not on file   Social Drivers of Health   Financial Resource Strain: Not on file  Food Insecurity: No Food Insecurity (06/24/2024)   Hunger Vital Sign    Worried About Running Out of Food in the Last Year: Never true    Ran Out of Food in the Last Year: Never true  Transportation Needs: No Transportation Needs (06/24/2024)   PRAPARE - Administrator, Civil Service (Medical): No    Lack of Transportation (Non-Medical): No  Physical  Activity: Not on file  Stress: Not on file  Social Connections: Unknown (05/13/2022)   Received from [facility]   Social Network    Social Network: Not on file  Intimate Partner Violence: Not At Risk (06/24/2024)   Humiliation, Afraid, Rape, and Kick questionnaire    Fear of Current or Ex-Partner: No    Emotionally Abused: No    Physically Abused: No    Sexually Abused: No    FAMILY HISTORY: Family History  Problem Relation Age of Onset   Cancer Mother        lung cancer?   Cancer Father        bladder cancer and lung cancer    ALLERGIES:  is allergic to  other, macrobid [nitrofurantoin monohyd macro], and codeine sulfate.  MEDICATIONS:  Current Outpatient Medications  Medication Sig Dispense Refill   aspirin 81 MG tablet Take 81 mg by mouth daily.     cholecalciferol (VITAMIN D ) 1000 UNITS tablet Take 5,000 Units by mouth daily.      escitalopram (LEXAPRO) 10 MG tablet Take 10 mg by mouth daily.     glimepiride (AMARYL) 1 MG tablet Take 1 mg by mouth daily with breakfast.     glucose blood test strip 1 each by Other route daily. Use as instructed     hydrochlorothiazide (HYDRODIURIL) 25 MG tablet Take 25 mg by mouth daily.     lisinopril (PRINIVIL,ZESTRIL) 10 MG tablet Take 20 mg by mouth daily.      metFORMIN (GLUCOPHAGE) 500 MG tablet Take 500 mg by mouth 2 (two) times daily with a meal.     ONETOUCH DELICA LANCETS FINE MISC by Does not apply route daily.     No current facility-administered medications for this visit.    REVIEW OF SYSTEMS:   Constitutional: Denies fevers, chills or abnormal night sweats Eyes: Denies blurriness of vision, double vision or watery eyes Ears, nose, mouth, throat, and face: Denies mucositis or sore throat Respiratory: Denies cough, dyspnea or wheezes Cardiovascular: Denies palpitation, chest discomfort or lower extremity swelling Gastrointestinal:  Denies nausea, heartburn or change in bowel habits Skin: Denies abnormal skin rashes Lymphatics: Denies new lymphadenopathy or easy bruising Neurological:Denies numbness, tingling or new weaknesses Behavioral/Psych: Mood is stable, no new changes  All other systems were reviewed with the patient and are negative.  PHYSICAL EXAMINATION: ECOG PERFORMANCE STATUS: 0 - Asymptomatic  Vitals:   06/24/24 1110  BP: 128/76  Pulse: (!) 108  Resp: 19  Temp: 97.7 F (36.5 C)  SpO2: 98%   Filed Weights   06/24/24 1110  Weight: 212 lb 9.6 oz (96.4 kg)    GENERAL:alert, no distress and comfortable SKIN: skin color, texture, turgor are normal, no rashes or  significant lesions EYES: normal, conjunctiva are pink and non-injected, sclera clear OROPHARYNX:no exudate, no erythema and lips, buccal mucosa, and tongue normal  NECK: supple, thyroid normal size, non-tender, without nodularity LYMPH:  no palpable lymphadenopathy in the cervical, axillary or inguinal LUNGS: clear to auscultation and percussion with normal breathing effort HEART: regular rate & rhythm and no murmurs and no lower extremity edema ABDOMEN:abdomen soft, non-tender and normal bowel sounds Musculoskeletal:no cyanosis of digits and no clubbing  PSYCH: alert & oriented x 3 with fluent speech NEURO: no focal motor/sensory deficits  Physical Exam    LABORATORY DATA:  I have reviewed the data as listed     No data to display          @cmpl @  RADIOGRAPHIC STUDIES:  I have personally reviewed the radiological images as listed and agreed with the findings in the report. No results found.  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 Biopsy on May 13, 2024, confirmed ADH, a benign condition with a risk of future breast cancer up to 30%. - Schedule surgery for August 05, 2024, to remove the ADH area. - I discussed intensive breast cancer screening.  Recommend contrast-enhanced mammogram and MRI for future screening, alternating every six months. -We discussed risk for breast cancer.  I recommend a healthy lifestyle, including more lean meat, fresh vegetables and fruits, last carb and no preserved food.  I also encouraged her to exercise regularly and try to lose weight.  She also has stopped alcohol and smoking. - Discuss tamoxifen 5 mg daily for three years as a preventive measure, to be considered after six months of lifestyle changes. - Encourage weight loss and healthy diet to reduce cancer risk.  Type 2 Diabetes  Mellitus, Uncontrolled Diabetes is not well controlled, with a recent A1c of approximately 7.5-7.6. - Encourage weight loss and healthy diet to improve diabetes control. - Monitor blood glucose levels regularly.  Hypertension Hypertension is being managed with lisinopril.  Hyperlipidemia Hyperlipidemia is being managed with medication.  Depression Depression and anxiety are being managed with Lexapro, which is reported to be effective. Lexapro may also help manage hot flashes associated with tamoxifen. - Continue Lexapro for depression and anxiety management.  Plan - Imaging and biopsy results reviewed with patient - Agree with surgery, she is scheduled for lumpectomy on August 05, 2024 - I encouraged her to have healthy lifestyle and weight loss, she is interested - I recommend low-dose tamoxifen 5 mg daily for 3 years for breast cancer risk reduction.  She is interested, but wants to lose weight first.  - Follow-up in 6 months, I will prescribe tamoxifen on next visit.   No orders of the defined types were placed in this encounter.   All questions were answered. The patient knows to call the clinic with any problems, questions or concerns. I spent 35 minutes counseling the patient face to face. The total time spent in the appointment was 45 minutes including review of chart and various tests results, discussions about plan of care and coordination of care plan.     Onita Mattock, MD 06/24/2024 5:35 PM

## 2024-08-01 ENCOUNTER — Encounter (HOSPITAL_BASED_OUTPATIENT_CLINIC_OR_DEPARTMENT_OTHER): Payer: Self-pay | Admitting: General Surgery

## 2024-08-02 ENCOUNTER — Encounter (HOSPITAL_BASED_OUTPATIENT_CLINIC_OR_DEPARTMENT_OTHER)
Admission: RE | Admit: 2024-08-02 | Discharge: 2024-08-02 | Disposition: A | Discharge status: Home / Self Care | Source: Ambulatory Visit | Attending: General Surgery

## 2024-08-02 ENCOUNTER — Ambulatory Visit
Admission: RE | Admit: 2024-08-02 | Discharge: 2024-08-02 | Disposition: A | Discharge status: Home / Self Care | Source: Ambulatory Visit | Attending: General Surgery | Admitting: General Surgery

## 2024-08-02 DIAGNOSIS — Z01812 Encounter for preprocedural laboratory examination: Secondary | ICD-10-CM | POA: Diagnosis present

## 2024-08-02 DIAGNOSIS — N6091 Unspecified benign mammary dysplasia of right breast: Secondary | ICD-10-CM

## 2024-08-02 DIAGNOSIS — Z01818 Encounter for other preprocedural examination: Secondary | ICD-10-CM | POA: Diagnosis not present

## 2024-08-02 DIAGNOSIS — E1165 Type 2 diabetes mellitus with hyperglycemia: Secondary | ICD-10-CM | POA: Insufficient documentation

## 2024-08-02 DIAGNOSIS — Z01810 Encounter for preprocedural cardiovascular examination: Secondary | ICD-10-CM | POA: Diagnosis present

## 2024-08-02 HISTORY — PX: BREAST BIOPSY: SHX20

## 2024-08-02 LAB — BASIC METABOLIC PANEL WITH GFR
Anion gap: 12 (ref 5–15)
BUN: 15 mg/dL (ref 8–23)
CO2: 27 mmol/L (ref 22–32)
Calcium: 9.3 mg/dL (ref 8.9–10.3)
Chloride: 101 mmol/L (ref 98–111)
Creatinine, Ser: 0.63 mg/dL (ref 0.44–1.00)
GFR, Estimated: >60 mL/min (ref >60)
Glucose, Bld: 169 mg/dL — ABNORMAL HIGH (ref 70–99)
Potassium: 4.6 mmol/L (ref 3.5–5.1)
Sodium: 140 mmol/L (ref 135–145)

## 2024-08-02 MED ORDER — CHLORHEXIDINE GLUCONATE CLOTH 2 % EX PADS: 6.0000 | MEDICATED_PAD | Freq: Once | CUTANEOUS | Status: DC

## 2024-08-02 NOTE — Progress Notes (Signed)

## 2024-08-04 NOTE — Anesthesia Preprocedure Evaluation (Signed)
 Anesthesia Evaluation  Patient identified by MRN, date of birth, ID band Patient awake    Reviewed: Allergy & Precautions, NPO status , Patient's Chart, lab work & pertinent test results  Airway Mallampati: III  TM Distance: >3 FB Neck ROM: Full    Dental  (+) Teeth Intact, Dental Advisory Given   Pulmonary former smoker Snores at night, no sleep study   Pulmonary exam normal breath sounds clear to auscultation       Cardiovascular hypertension (144/81 preop), Pt. on medications Normal cardiovascular exam Rhythm:Regular Rate:Normal     Neuro/Psych  PSYCHIATRIC DISORDERS Anxiety Depression    negative neurological ROS     GI/Hepatic negative GI ROS, Neg liver ROS,,,  Endo/Other  diabetes, Well Controlled, Type 2  Obesity BMI 37  Renal/GU negative Renal ROS  negative genitourinary   Musculoskeletal negative musculoskeletal ROS (+)    Abdominal  (+) + obese  Peds  Hematology negative hematology ROS (+)   Anesthesia Other Findings   Reproductive/Obstetrics negative OB ROS                              Anesthesia Physical Anesthesia Plan  ASA: 2  Anesthesia Plan: General   Post-op Pain Management: Tylenol  PO (pre-op)*   Induction: Intravenous  PONV Risk Score and Plan: 3 and Ondansetron , Dexamethasone , Midazolam  and Treatment may vary due to age or medical condition  Airway Management Planned: LMA  Additional Equipment: None  Intra-op Plan:   Post-operative Plan: Extubation in OR  Informed Consent: I have reviewed the patients History and Physical, chart, labs and discussed the procedure including the risks, benefits and alternatives for the proposed anesthesia with the patient or authorized representative who has indicated his/her understanding and acceptance.     Dental advisory given  Plan Discussed with: CRNA  Anesthesia Plan Comments:          Anesthesia Quick  Evaluation

## 2024-08-05 ENCOUNTER — Ambulatory Visit (HOSPITAL_BASED_OUTPATIENT_CLINIC_OR_DEPARTMENT_OTHER): Payer: Self-pay | Admitting: Anesthesiology

## 2024-08-05 ENCOUNTER — Encounter (HOSPITAL_BASED_OUTPATIENT_CLINIC_OR_DEPARTMENT_OTHER)
Admission: RE | Disposition: A | Discharge status: Home / Self Care | Payer: Self-pay | Source: Home / Self Care | Attending: General Surgery

## 2024-08-05 ENCOUNTER — Encounter (HOSPITAL_BASED_OUTPATIENT_CLINIC_OR_DEPARTMENT_OTHER): Payer: Self-pay | Admitting: General Surgery

## 2024-08-05 ENCOUNTER — Ambulatory Visit
Admission: RE | Admit: 2024-08-05 | Discharge: 2024-08-05 | Disposition: A | Discharge status: Home / Self Care | Source: Ambulatory Visit | Attending: General Surgery | Admitting: General Surgery

## 2024-08-05 ENCOUNTER — Other Ambulatory Visit: Payer: Self-pay

## 2024-08-05 ENCOUNTER — Ambulatory Visit (HOSPITAL_BASED_OUTPATIENT_CLINIC_OR_DEPARTMENT_OTHER)
Admission: RE | Admit: 2024-08-05 | Discharge: 2024-08-05 | Disposition: A | Discharge status: Home / Self Care | Attending: General Surgery | Admitting: General Surgery

## 2024-08-05 DIAGNOSIS — Z87891 Personal history of nicotine dependence: Secondary | ICD-10-CM | POA: Diagnosis not present

## 2024-08-05 DIAGNOSIS — N6091 Unspecified benign mammary dysplasia of right breast: Secondary | ICD-10-CM | POA: Diagnosis present

## 2024-08-05 DIAGNOSIS — C50911 Malignant neoplasm of unspecified site of right female breast: Secondary | ICD-10-CM | POA: Insufficient documentation

## 2024-08-05 DIAGNOSIS — E669 Obesity, unspecified: Secondary | ICD-10-CM | POA: Diagnosis not present

## 2024-08-05 DIAGNOSIS — I10 Essential (primary) hypertension: Secondary | ICD-10-CM | POA: Diagnosis not present

## 2024-08-05 DIAGNOSIS — E1165 Type 2 diabetes mellitus with hyperglycemia: Secondary | ICD-10-CM

## 2024-08-05 DIAGNOSIS — E119 Type 2 diabetes mellitus without complications: Secondary | ICD-10-CM | POA: Diagnosis not present

## 2024-08-05 DIAGNOSIS — Z79899 Other long term (current) drug therapy: Secondary | ICD-10-CM | POA: Insufficient documentation

## 2024-08-05 DIAGNOSIS — Z6837 Body mass index (BMI) 37.0-37.9, adult: Secondary | ICD-10-CM | POA: Diagnosis not present

## 2024-08-05 DIAGNOSIS — Z7984 Long term (current) use of oral hypoglycemic drugs: Secondary | ICD-10-CM | POA: Insufficient documentation

## 2024-08-05 HISTORY — PX: BREAST LUMPECTOMY WITH RADIOACTIVE SEED LOCALIZATION: SHX6424

## 2024-08-05 LAB — GLUCOSE, CAPILLARY
Glucose-Capillary: 123 mg/dL — ABNORMAL HIGH (ref 70–99)
Glucose-Capillary: 128 mg/dL — ABNORMAL HIGH (ref 70–99)

## 2024-08-05 SURGERY — BREAST LUMPECTOMY WITH RADIOACTIVE SEED LOCALIZATION
Anesthesia: General | Site: Breast | Laterality: Right

## 2024-08-05 MED ORDER — DEXAMETHASONE SODIUM PHOSPHATE 10 MG/ML IJ SOLN
INTRAMUSCULAR | Status: AC
  Filled 2024-08-05: qty 1

## 2024-08-05 MED ORDER — ONDANSETRON HCL 4 MG/2ML IJ SOLN
INTRAMUSCULAR | Status: AC
  Filled 2024-08-05: qty 2

## 2024-08-05 MED ORDER — 0.9 % SODIUM CHLORIDE (POUR BTL) OPTIME
TOPICAL | Status: DC | PRN
  Administered 2024-08-05: 1000 mL

## 2024-08-05 MED ORDER — FENTANYL CITRATE (PF) 100 MCG/2ML IJ SOLN
INTRAMUSCULAR | Status: DC | PRN
  Administered 2024-08-05: 50 ug via INTRAVENOUS

## 2024-08-05 MED ORDER — OXYCODONE HCL 5 MG PO TABS: 5.0000 mg | ORAL_TABLET | Freq: Once | ORAL | Status: DC | PRN

## 2024-08-05 MED ORDER — CEFAZOLIN SODIUM-DEXTROSE 2-4 GM/100ML-% IV SOLN
2.0000 g | INTRAVENOUS | Status: AC
  Administered 2024-08-05: 2 g via INTRAVENOUS

## 2024-08-05 MED ORDER — HYDROMORPHONE HCL 1 MG/ML IJ SOLN: 0.2500 mg | INTRAMUSCULAR | Status: DC | PRN

## 2024-08-05 MED ORDER — LIDOCAINE HCL (CARDIAC) PF 100 MG/5ML IV SOSY
PREFILLED_SYRINGE | INTRAVENOUS | Status: DC | PRN
  Administered 2024-08-05: 20 mg via INTRAVENOUS

## 2024-08-05 MED ORDER — ACETAMINOPHEN 500 MG PO TABS: 1000.0000 mg | ORAL_TABLET | Freq: Once | ORAL | Status: AC

## 2024-08-05 MED ORDER — ONDANSETRON HCL 4 MG/2ML IJ SOLN: 4.0000 mg | Freq: Once | INTRAMUSCULAR | Status: DC | PRN

## 2024-08-05 MED ORDER — TRAMADOL HCL 50 MG PO TABS: 50.0000 mg | ORAL_TABLET | Freq: Four times a day (QID) | ORAL | 0 refills | Status: DC | PRN

## 2024-08-05 MED ORDER — DEXAMETHASONE SODIUM PHOSPHATE 4 MG/ML IJ SOLN
INTRAMUSCULAR | Status: DC | PRN
Start: 2024-08-05 — End: 2024-08-05
  Administered 2024-08-05: 5 mg via INTRAVENOUS

## 2024-08-05 MED ORDER — LACTATED RINGERS IV SOLN: INTRAVENOUS | Status: DC

## 2024-08-05 MED ORDER — CEFAZOLIN SODIUM-DEXTROSE 2-4 GM/100ML-% IV SOLN
INTRAVENOUS | Status: AC
  Filled 2024-08-05: qty 100

## 2024-08-05 MED ORDER — EPHEDRINE 5 MG/ML INJ
INTRAVENOUS | Status: AC
  Filled 2024-08-05: qty 5

## 2024-08-05 MED ORDER — GABAPENTIN 100 MG PO CAPS
ORAL_CAPSULE | ORAL | Status: AC
  Filled 2024-08-05: qty 1

## 2024-08-05 MED ORDER — FENTANYL CITRATE (PF) 100 MCG/2ML IJ SOLN
INTRAMUSCULAR | Status: AC
Start: 2024-08-05 — End: 2024-08-05
  Filled 2024-08-05: qty 2

## 2024-08-05 MED ORDER — PROPOFOL 10 MG/ML IV BOLUS
INTRAVENOUS | Status: DC | PRN
  Administered 2024-08-05: 150 mg via INTRAVENOUS

## 2024-08-05 MED ORDER — ACETAMINOPHEN 500 MG PO TABS
1000.0000 mg | ORAL_TABLET | ORAL | Status: AC
  Administered 2024-08-05: 1000 mg via ORAL

## 2024-08-05 MED ORDER — ONDANSETRON HCL 4 MG/2ML IJ SOLN
INTRAMUSCULAR | Status: DC | PRN
  Administered 2024-08-05: 4 mg via INTRAVENOUS

## 2024-08-05 MED ORDER — MIDAZOLAM HCL 5 MG/5ML IJ SOLN
INTRAMUSCULAR | Status: DC | PRN
  Administered 2024-08-05: 2 mg via INTRAVENOUS

## 2024-08-05 MED ORDER — GABAPENTIN 100 MG PO CAPS
100.0000 mg | ORAL_CAPSULE | ORAL | Status: AC
  Administered 2024-08-05: 100 mg via ORAL

## 2024-08-05 MED ORDER — BUPIVACAINE-EPINEPHRINE (PF) 0.25% -1:200000 IJ SOLN
INTRAMUSCULAR | Status: DC | PRN
  Administered 2024-08-05: 19 mL

## 2024-08-05 MED ORDER — PHENYLEPHRINE 80 MCG/ML (10ML) SYRINGE FOR IV PUSH (FOR BLOOD PRESSURE SUPPORT)
PREFILLED_SYRINGE | INTRAVENOUS | Status: AC
  Filled 2024-08-05: qty 10

## 2024-08-05 MED ORDER — LIDOCAINE 2% (20 MG/ML) 5 ML SYRINGE
INTRAMUSCULAR | Status: AC
  Filled 2024-08-05: qty 5

## 2024-08-05 MED ORDER — SUCCINYLCHOLINE CHLORIDE 200 MG/10ML IV SOSY
PREFILLED_SYRINGE | INTRAVENOUS | Status: AC
  Filled 2024-08-05: qty 10

## 2024-08-05 MED ORDER — MIDAZOLAM HCL 2 MG/2ML IJ SOLN
INTRAMUSCULAR | Status: AC
  Filled 2024-08-05: qty 2

## 2024-08-05 MED ORDER — ACETAMINOPHEN 500 MG PO TABS
ORAL_TABLET | ORAL | Status: AC
  Filled 2024-08-05: qty 2

## 2024-08-05 MED ORDER — ATROPINE SULFATE 0.4 MG/ML IV SOLN
INTRAVENOUS | Status: AC
  Filled 2024-08-05: qty 1

## 2024-08-05 MED ORDER — OXYCODONE HCL 5 MG/5ML PO SOLN: 5.0000 mg | Freq: Once | ORAL | Status: DC | PRN

## 2024-08-05 MED ORDER — AMISULPRIDE (ANTIEMETIC) 5 MG/2ML IV SOLN: 10.0000 mg | Freq: Once | INTRAVENOUS | Status: DC | PRN

## 2024-08-05 SURGICAL SUPPLY — 32 items
BLADE SURG 15 STRL LF DISP TIS (BLADE) ×1 IMPLANT
CANISTER SUC SOCK COL 7IN (MISCELLANEOUS) ×1 IMPLANT
CANISTER SUCT 1200ML W/VALVE (MISCELLANEOUS) ×1 IMPLANT
CHLORAPREP W/TINT 26 (MISCELLANEOUS) ×1 IMPLANT
CLIP APPLIE 9.375 MED OPEN (MISCELLANEOUS) IMPLANT
COVER BACK TABLE 60X90IN (DRAPES) ×1 IMPLANT
COVER MAYO STAND STRL (DRAPES) ×1 IMPLANT
COVER PROBE CYLINDRICAL 5X96 (MISCELLANEOUS) ×1 IMPLANT
DERMABOND ADVANCED .7 DNX12 (GAUZE/BANDAGES/DRESSINGS) ×1 IMPLANT
DRAPE LAPAROSCOPIC ABDOMINAL (DRAPES) ×1 IMPLANT
DRAPE UTILITY XL STRL (DRAPES) ×1 IMPLANT
ELECT COATED BLADE 2.86 ST (ELECTRODE) ×1 IMPLANT
ELECTRODE REM PT RTRN 9FT ADLT (ELECTROSURGICAL) ×1 IMPLANT
GLOVE BIO SURGEON STRL SZ7.5 (GLOVE) ×2 IMPLANT
GOWN STRL REUS W/ TWL LRG LVL3 (GOWN DISPOSABLE) ×2 IMPLANT
KIT MARKER MARGIN INK (KITS) ×1 IMPLANT
NDL HYPO 25X1 1.5 SAFETY (NEEDLE) IMPLANT
NEEDLE HYPO 25X1 1.5 SAFETY (NEEDLE) IMPLANT
NS IRRIG 1000ML POUR BTL (IV SOLUTION) IMPLANT
PACK BASIN DAY SURGERY FS (CUSTOM PROCEDURE TRAY) ×1 IMPLANT
PENCIL SMOKE EVACUATOR (MISCELLANEOUS) ×1 IMPLANT
SLEEVE SCD COMPRESS KNEE MED (STOCKING) ×1 IMPLANT
SPIKE FLUID TRANSFER (MISCELLANEOUS) IMPLANT
SPONGE T-LAP 18X18 ~~LOC~~+RFID (SPONGE) ×1 IMPLANT
SUT MON AB 4-0 PC3 18 (SUTURE) ×1 IMPLANT
SUT SILK 2 0 SH (SUTURE) IMPLANT
SUT VICRYL 3-0 CR8 SH (SUTURE) ×1 IMPLANT
SYR CONTROL 10ML LL (SYRINGE) IMPLANT
TOWEL GREEN STERILE FF (TOWEL DISPOSABLE) ×1 IMPLANT
TRAY FAXITRON CT DISP (TRAY / TRAY PROCEDURE) ×1 IMPLANT
TUBE CONNECTING 20X1/4 (TUBING) ×1 IMPLANT
YANKAUER SUCT BULB TIP NO VENT (SUCTIONS) IMPLANT

## 2024-08-05 NOTE — Anesthesia Procedure Notes (Signed)
 Procedure Name: LMA Insertion Date/Time: 08/05/2024 9:24 AM  Performed by: Emilio Rock BIRCH, CRNAPre-anesthesia Checklist: Patient identified, Emergency Drugs available, Suction available and Patient being monitored Patient Re-evaluated:Patient Re-evaluated prior to induction Oxygen Delivery Method: Circle System Utilized Preoxygenation: Pre-oxygenation with 100% oxygen Induction Type: IV induction Ventilation: Mask ventilation without difficulty LMA: LMA inserted LMA Size: 4.0 Number of attempts: 1 Airway Equipment and Method: bite block Placement Confirmation: positive ETCO2 Tube secured with: Tape Dental Injury: Teeth and Oropharynx as per pre-operative assessment

## 2024-08-05 NOTE — Interval H&P Note (Signed)
 History and Physical Interval Note:  08/05/2024 7:30 AM  Jenny Maldonado  has presented today for surgery, with the diagnosis of RIGHT BREAST ADH.  The various methods of treatment have been discussed with the patient and family. After consideration of risks, benefits and other options for treatment, the patient has consented to  Procedure(s): BREAST LUMPECTOMY WITH RADIOACTIVE SEED LOCALIZATION (Right) as a surgical intervention.  The patient's history has been reviewed, patient examined, no change in status, stable for surgery.  I have reviewed the patient's chart and labs.  Questions were answered to the patient's satisfaction.     Deward Null III

## 2024-08-05 NOTE — Anesthesia Postprocedure Evaluation (Signed)
 Anesthesia Post Note  Patient: Jenny Maldonado  Procedure(s) Performed: BREAST LUMPECTOMY WITH RADIOACTIVE SEED LOCALIZATION (Right: Breast)     Patient location during evaluation: PACU Anesthesia Type: General Level of consciousness: awake and alert, oriented and patient cooperative Pain management: pain level controlled Vital Signs Assessment: post-procedure vital signs reviewed and stable Respiratory status: spontaneous breathing, nonlabored ventilation and respiratory function stable Cardiovascular status: blood pressure returned to baseline and stable Postop Assessment: no apparent nausea or vomiting Anesthetic complications: no   No notable events documented.  Last Vitals:  Vitals:   08/05/24 1015 08/05/24 1030  BP: (!) 148/72   Pulse: 77 74  Resp: 13 12  Temp:    SpO2: 98%     Last Pain:  Vitals:   08/05/24 1010  TempSrc:   PainSc: Asleep                 Almarie CHRISTELLA Marchi

## 2024-08-05 NOTE — Discharge Instructions (Signed)
 No Tylenol  before 2:00pm.  Post Anesthesia Home Care Instructions  Activity: Get plenty of rest for the remainder of the day. A responsible individual must stay with you for 24 hours following the procedure.  For the next 24 hours, DO NOT: -Drive a car -Advertising copywriter -Drink alcoholic beverages -Take any medication unless instructed by your physician -Make any legal decisions or sign important papers.  Meals: Start with liquid foods such as gelatin or soup. Progress to regular foods as tolerated. Avoid greasy, spicy, heavy foods. If nausea and/or vomiting occur, drink only clear liquids until the nausea and/or vomiting subsides. Call your physician if vomiting continues.  Special Instructions/Symptoms: Your throat may feel dry or sore from the anesthesia or the breathing tube placed in your throat during surgery. If this causes discomfort, gargle with warm salt water. The discomfort should disappear within 24 hours.

## 2024-08-05 NOTE — Op Note (Signed)
 08/05/2024  10:00 AM  PATIENT:  Jenny Maldonado  68 y.o. female  PRE-OPERATIVE DIAGNOSIS:  RIGHT BREAST ADH  POST-OPERATIVE DIAGNOSIS:  RIGHT BREAST ADH  PROCEDURE:  Procedure(s): BREAST LUMPECTOMY WITH RADIOACTIVE SEED LOCALIZATION (Right)  SURGEON:  Surgeons and Role:    DEWAINE Curvin Deward DOUGLAS, MD - Primary  PHYSICIAN ASSISTANT:   ASSISTANTS: Dr. Eva Barrier   ANESTHESIA:   local and general  EBL:  10 mL   BLOOD ADMINISTERED:none  DRAINS: none   LOCAL MEDICATIONS USED:  MARCAINE      SPECIMEN:  Source of Specimen:  right breast tissue  DISPOSITION OF SPECIMEN:  PATHOLOGY  COUNTS:  YES  TOURNIQUET:  * No tourniquets in log *  DICTATION: .Dragon Dictation  After informed consent was obtained the patient was brought to the operating room and placed in the supine position on the operating room table.  After adequate induction of general anesthesia the patient's right breast was prepped with DuraPrep, allowed to dry, and draped in the usual sterile manner.  An appropriate timeout was performed.  Previously an I-125 seed was placed in the inner aspect of the right breast to mark an area of atypical ductal hyperplasia.  The neoprobe was set to I-125 in the area of radioactivity was readily identified.  The area around this was infiltrated with quarter percent Marcaine .  There was no good place to hide the incision so I made a transversely oriented incision overlying the area of radioactivity with a 15 blade knife.  The incision was carried through the skin and subcutaneous tissue sharply with the electrocautery.  Dissection was then carried around the radioactive seed while checking the area of radioactivity frequently.  Once the tissue was removed it was oriented with the appropriate paint colors.  A specimen radiograph was obtained that showed the clip and seed to be within the specimen.  I did encounter some whitish granular tissue during the dissection and make sure that this was removed  with the specimen.  The specimen was then sent to pathology for further evaluation.  Hemostasis was achieved using the Bovie electrocautery.  The wound was irrigated with saline and infiltrated with more quarter percent Marcaine .  The deep layer of the incision was then closed with layers of interrupted 3-0 Vicryl stitches.  The skin was closed with a running 4-0 Monocryl subcuticular stitch.  Dermabond dressings were applied.  The patient tolerated the procedure well.  At the end of the case all needle sponge and instrument counts were correct.  The patient was then awakened and taken to recovery in stable condition.  PLAN OF CARE: Discharge to home after PACU  PATIENT DISPOSITION:  PACU - hemodynamically stable.   Delay start of Pharmacological VTE agent (>24hrs) due to surgical blood loss or risk of bleeding: not applicable

## 2024-08-05 NOTE — Transfer of Care (Signed)
 Immediate Anesthesia Transfer of Care Note  Patient: Jenny Maldonado  Procedure(s) Performed: BREAST LUMPECTOMY WITH RADIOACTIVE SEED LOCALIZATION (Right: Breast)  Patient Location: PACU  Anesthesia Type:General  Level of Consciousness: awake, alert , oriented, drowsy, and patient cooperative  Airway & Oxygen Therapy: Patient Spontanous Breathing and Patient connected to face mask oxygen  Post-op Assessment: Report given to RN and Post -op Vital signs reviewed and stable  Post vital signs: Reviewed and stable  Last Vitals:  Vitals Value Taken Time  BP    Temp    Pulse    Resp    SpO2      Last Pain:  Vitals:   08/05/24 0750  TempSrc: Oral  PainSc: 0-No pain      Patients Stated Pain Goal: 1 (08/05/24 0750)  Complications: No notable events documented.

## 2024-08-05 NOTE — H&P (Signed)
 REFERRING PHYSICIAN: Pridgen, Taylar, NP PROVIDER: DEWARD GARNETTE NULL, MD MRN: [id number] DOB: 1956/11/28 Subjective    Chief Complaint: New Consultation  History of Present Illness: Jenny Maldonado is a 68 y.o. female who is seen today as an office consultation for evaluation of New Consultation  We are asked to see the patient in consultation by Dr. Almarie Das to evaluate her for right breast atypical ductal hyperplasia. The patient is a 68 year old white female who recently went for a routine screening mammogram. At that time she was found to have a 1.9 cm area of distortion in the lower inner quadrant of the right breast. 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 She denies any family history of breast cancer. She does have hypertension and diabetes but does not smoke. She did have a reaction to the chlorhexidine  prep from her biopsy  Review of Systems: A complete review of systems was obtained from the patient. I have reviewed this information and discussed as appropriate with the patient. See HPI as well for other ROS.  ROS   Medical History: Past Medical History:  Diagnosis Date  Anxiety  Diabetes mellitus without complication (CMS/HHS-HCC)  Hyperlipidemia  Hypertension   Patient Active Problem List  Diagnosis  Atypical ductal hyperplasia of right breast   History reviewed. No pertinent surgical history.   Allergies  Allergen Reactions  Other Other (See Comments)  BD ChloraPrep  Rash with blistering  Nitrofurantoin Monohyd/M-Cryst Hives  Codeine Sulfate Other (See Comments)  Upset stomach.   Current Outpatient Medications on File Prior to Visit  Medication Sig Dispense Refill  aspirin 81 MG chewable tablet 1 tablet Orally Once a day  atorvastatin (LIPITOR) 20 MG tablet TAKE 1 TABLET BY MOUTH EVERY DAY Orally Once a day  cholecalciferol (VITAMIN D3) 1000 unit tablet Take 5,000 Units by mouth once daily   escitalopram oxalate (LEXAPRO) 10 MG tablet Take 1 tablet by mouth once daily  FUROsemide (LASIX) 20 MG tablet Take 20 mg by mouth  glimepiride (AMARYL) 1 MG tablet Take 1 mg by mouth  hydroCHLOROthiazide (HYDRODIURIL) 25 MG tablet Take 25 mg by mouth once daily  lisinopriL (ZESTRIL) 20 MG tablet 1 tablet Orally Once a day for 90 days  metFORMIN (GLUCOPHAGE) 500 MG tablet Take 500 mg by mouth 2 (two) times daily with meals   No current facility-administered medications on file prior to visit.   Family History  Problem Relation Age of Onset  High blood pressure (Hypertension) Mother  Coronary Artery Disease (Blocked arteries around heart) Mother  Coronary Artery Disease (Blocked arteries around heart) Father  Diabetes Father  Obesity Sister  Coronary Artery Disease (Blocked arteries around heart) Sister    Social History   Tobacco Use  Smoking Status Never  Smokeless Tobacco Never    Social History   Socioeconomic History  Marital status: Married  Tobacco Use  Smoking status: Never  Smokeless tobacco: Never  Vaping Use  Vaping status: Never Used  Substance and Sexual Activity  Alcohol use: Not Currently  Drug use: Never   Social Drivers of Health   Received from [employer]  Social Network  Housing Stability: Unknown (05/22/2024)  Housing Stability Vital Sign  Homeless in the Last Year: No   Objective:   Vitals:  BP: 138/80  Pulse: 97  Temp: 36.7 C (98 F)  SpO2: 98%  Weight: 95.9 kg (211 lb 6.4 oz)  Height: 160 cm (5' 3)  PainSc: 0-No pain  PainLoc:  Breast   Body mass index is 37.45 kg/m.  Physical Exam Vitals reviewed.  Constitutional:  General: She is not in acute distress. Appearance: Normal appearance.  HENT:  Head: Normocephalic and atraumatic.  Right Ear: External ear normal.  Left Ear: External ear normal.  Nose: Nose normal.  Mouth/Throat:  Mouth: Mucous membranes are moist.  Pharynx: Oropharynx is clear.  Eyes:  General: No  scleral icterus. Extraocular Movements: Extraocular movements intact.  Conjunctiva/sclera: Conjunctivae normal.  Pupils: Pupils are equal, round, and reactive to light.  Cardiovascular:  Rate and Rhythm: Normal rate and regular rhythm.  Pulses: Normal pulses.  Heart sounds: Normal heart sounds.  Pulmonary:  Effort: Pulmonary effort is normal. No respiratory distress.  Breath sounds: Normal breath sounds.  Abdominal:  General: Bowel sounds are normal.  Palpations: Abdomen is soft.  Tenderness: There is no abdominal tenderness.  Musculoskeletal:  General: No swelling, tenderness or deformity. Normal range of motion.  Cervical back: Normal range of motion and neck supple.  Skin: General: Skin is warm and dry.  Coloration: Skin is not jaundiced.  Neurological:  General: No focal deficit present.  Mental Status: She is alert and oriented to person, place, and time.  Psychiatric:  Mood and Affect: Mood normal.  Behavior: Behavior normal.     Breast: There is no palpable mass in either breast. There is no palpable axillary, supraclavicular, or cervical lymphadenopathy.  Labs, Imaging and Diagnostic Testing:  Assessment and Plan:   Diagnoses and all orders for this visit:  Atypical ductal hyperplasia of right breast - [facility] Case Posting Request; Future - Ambulatory Referral to Oncology-Medical   The patient appears to have a 1.9 cm area of papilloma and atypical ductal hyperplasia in the lower inner quadrant of the right breast. Because atypical duct hyperplasia is considered a high risk lesion and can elevate her lifetime risk of breast cancer to around 30% and because it can have an appearance similar to preinvasive cancer my recommendation would be to have this area removed. She would also like to have this done. I have discussed with her in detail the risks and benefits of the operation as well as some of the technical aspects including use of a radioactive seed for localization  and she understands and wishes to proceed. We will also refer her to the high risk clinic at the cancer center to talk about risk reduction.

## 2024-08-06 ENCOUNTER — Encounter (HOSPITAL_BASED_OUTPATIENT_CLINIC_OR_DEPARTMENT_OTHER): Payer: Self-pay | Admitting: General Surgery

## 2024-08-09 ENCOUNTER — Telehealth: Payer: Self-pay | Admitting: Hematology

## 2024-08-09 ENCOUNTER — Ambulatory Visit: Payer: Self-pay | Admitting: General Surgery

## 2024-08-09 LAB — SURGICAL PATHOLOGY

## 2024-08-09 NOTE — Telephone Encounter (Signed)
 Called and spoke with patient and she is aware of her upcoming appt

## 2024-08-11 ENCOUNTER — Other Ambulatory Visit: Payer: Self-pay

## 2024-08-11 ENCOUNTER — Encounter (HOSPITAL_BASED_OUTPATIENT_CLINIC_OR_DEPARTMENT_OTHER): Payer: Self-pay | Admitting: General Surgery

## 2024-08-15 ENCOUNTER — Telehealth: Payer: Self-pay | Admitting: Radiation Oncology

## 2024-08-15 ENCOUNTER — Other Ambulatory Visit: Payer: Self-pay

## 2024-08-15 DIAGNOSIS — N6091 Unspecified benign mammary dysplasia of right breast: Secondary | ICD-10-CM

## 2024-08-15 NOTE — Progress Notes (Signed)
 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 female seen in consultation for radiation therapy.  Jenny Maldonado has a screen detected DCIS. She initially underwent a bilateral screening mammogram on 04/01/24 which demonstrated a possible asymmetry with calcifications in the R breast requiring additional imaging. Subsequent diagnostic mammogram on 04/19/24 revealed a group of pleomorphic calcifications in the lower inner R breast, posterior depth, spanning approximately 1.5 cm with an associated irregular asymmetry.   Targeted US  revealed an irregular mass with angular margins measuring 1.9 x 0.6 x 0.5 cm corresponding to the mammographic abnormality with associated calcifications. No enlarged LN were identified. 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   She had consultations with Dr. Curvin in General Surgery and Dr. Lanny in medical oncology in June and after discussions with these providers she decided to undergo lumpectomy. This was completed on 08/05/24 and pathology revealed a 1.9 cm, grade 2 ductal carcinoma in situ, cribiform type, with necrosis. ER/PR were positive at 95% each. The medial margin was positive and the superior margin was only 0.1 mm. Biopsy site and clip were identified.  She went back for re-excision on 08/19/24 and pathology was negative for any residual malignancy.   Today, Jenny Maldonado reports she has recovered well from her re-excision. 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   Of note, she reports she started menstruating around age 37 and underwent menopause in her mid 56s. She has never taken birth control pills. She has one son who was born when she was 84 years old. She has never taken hormone replacement therapy.  She has a hx of diabetes, HTN and HLD. She reports these could be better controlled and she is hoping to lose some weight to help with these in the future.   PREVIOUS RADIATION THERAPY: No  AUTOIMMUNE DISEASE: No  MEDICAL DEVICES: No  PREGNANCY: No   PAST MEDICAL HISTORY:  Past Medical History:  Diagnosis Date   Anxiety    Bladder polyps    Depression    Diabetes mellitus without complication (HCC)    Elevated liver function tests    Hyperlipidemia    Hypertension    Vitamin D  deficiency        PAST SURGICAL HISTORY: Past Surgical History:  Procedure Laterality Date   BREAST BIOPSY Right 05/06/2024   US  RT BREAST BX W LOC DEV 1ST LESION IMG BX SPEC US  GUIDE 05/06/2024 [facility]   BREAST BIOPSY  08/02/2024   MM RT RADIOACTIVE SEED LOC MAMMO GUIDE 08/02/2024 [facility]   BREAST LUMPECTOMY WITH RADIOACTIVE SEED LOCALIZATION Right 08/05/2024   Procedure: BREAST LUMPECTOMY WITH RADIOACTIVE SEED LOCALIZATION;  Surgeon: Curvin Deward MOULD, MD;  Location: [facility];  Service: General;  Laterality: Right;   RE-EXCISION OF BREAST LUMPECTOMY Right 08/19/2024   Procedure: RE-EXCISION RIGHT BREAST MARGINS;  Surgeon: Curvin Deward MOULD, MD;  Location:  [facility];  Service: General;  Laterality: Right;  RE-EXCISION RIGHT BREAST MARGINS     FAMILY HISTORY:  Family History  Problem Relation Age of Onset   Cancer Mother        lung cancer?   Cancer Father        bladder cancer and lung cancer     SOCIAL HISTORY:  reports that she quit smoking about 33 years ago. Her smoking use included cigarettes. She does not have any smokeless tobacco history on file. She reports  current alcohol use. She reports that she does not use drugs.   ALLERGIES: Other, Macrobid [nitrofurantoin monohyd macro], Wound dressing adhesive, and Codeine sulfate   MEDICATIONS:  Current Outpatient Medications  Medication Sig Dispense Refill   aspirin 81 MG tablet Take 81 mg by mouth daily.     atorvastatin (LIPITOR) 20 MG tablet Take 20 mg by mouth daily.     cholecalciferol (VITAMIN D ) 1000 UNITS tablet Take 5,000 Units by mouth daily.      escitalopram (LEXAPRO) 10 MG tablet Take 10 mg by mouth daily.     glimepiride (AMARYL) 1 MG tablet Take 1 mg by mouth daily with breakfast.     glucose blood test strip 1 each by Other route daily. Use as instructed     hydrochlorothiazide (HYDRODIURIL) 25 MG tablet Take 25 mg by mouth daily.     lisinopril (PRINIVIL,ZESTRIL) 10 MG tablet Take 20 mg by mouth daily.      metFORMIN (GLUCOPHAGE) 500 MG tablet Take 500 mg by mouth 2 (two) times daily with a meal.     ONETOUCH DELICA LANCETS FINE MISC by Does not apply route daily.     traMADol  (ULTRAM ) 50 MG tablet Take 1 tablet (50 mg total) by mouth every 6 (six) hours as needed. 10 tablet 0   No current facility-administered medications for this encounter.     REVIEW OF SYSTEMS: The patient reports that she is doing well overall and a review of symptoms is otherwise negative.    PHYSICAL EXAM:  Wt Readings from Last 3 Encounters:  08/25/24 206 lb 3.2 oz (93.5 kg)  08/19/24 207 lb 10.8 oz (94.2 kg)  08/16/24 210 lb 5 oz (95.4 kg)   Temp Readings from Last 3 Encounters:  08/25/24 98.8 F (37.1 C) (Oral)  08/19/24 (!) 97.2 F (36.2 C)  08/16/24 97.6 F (36.4 C) (Temporal)   BP Readings from Last 3 Encounters:  08/25/24 (!) 141/87  08/19/24 133/71  08/16/24 (!) 141/81   Pulse Readings from Last 3 Encounters:  08/25/24 80  08/19/24 75  08/16/24 90   Pain Assessment Pain Score: 0-No pain/10  Physical Exam Constitutional:      General: She is not in acute distress.     Appearance: Normal appearance.  HENT:     Head: Normocephalic and atraumatic.  Eyes:     General: No scleral icterus.    Extraocular Movements: Extraocular movements intact.     Conjunctiva/sclera: Conjunctivae normal.  Cardiovascular:     Rate and Rhythm: Normal rate.  Pulmonary:     Effort: Pulmonary effort is normal. No respiratory distress.  Musculoskeletal:        General: Normal range of motion.     Cervical back: Normal range of motion and neck supple.  Skin:    General: Skin is warm and dry.  Neurological:     General: No focal deficit present.     Mental Status: She is alert and  oriented to person, place, and time.     Coordination: Coordination normal.  Psychiatric:        Mood and Affect: Mood normal.      Breast Exam:  Well-healed lumpectomy/re-excision scar in the upper inner quadrant of the R breast. No erythema, induration, or drainage. No palpable masses or nodularity at the surgical site. No skin changes, nipple inversion, or discharge.   ECOG = 0   LABORATORY DATA:  Lab Results  Component Value Date   WBC 9.2 08/16/2024   HGB 13.2 08/16/2024   HCT 39.5 08/16/2024   MCV 87.6 08/16/2024   PLT 273 08/16/2024   Lab Results  Component Value Date   NA 139 08/16/2024   K 4.2 08/16/2024   CL 103 08/16/2024   CO2 27 08/16/2024   Lab Results  Component Value Date   ALT 29 08/16/2024   AST 16 08/16/2024   ALKPHOS 50 08/16/2024   BILITOT 0.5 08/16/2024      RADIOGRAPHY:    MM Breast Surgical Specimen Result Date: 08/05/2024  CLINICAL DATA:  Post excision of a right breast lesion following radioactive seed localization. Assess surgical specimen. EXAM: SPECIMEN RADIOGRAPH OF THE RIGHT BREAST COMPARISON:  Previous exam(s). FINDINGS: Status post excision of the right breast. The radioactive seed and biopsy marker clip are present, completely intact, and were marked for pathology. Multiple calcifications are also present within the specimen. IMPRESSION:  Specimen radiograph of the right breast. Electronically Signed   By: Alm Parkins M.D.   On: 08/05/2024 11:02    DIGITAL DIAGNOSTIC UNILATERAL RIGHT MAMMOGRAM WITH TOMOSYNTHESIS AND CAD; ULTRASOUND RIGHT BREAST LIMITED 04/19/24:   TECHNIQUE: Right digital diagnostic mammography and breast tomosynthesis was performed. The images were evaluated with computer-aided detection. ; Targeted ultrasound examination of the right breast was performed   COMPARISON:  Previous exam(s).   ACR Breast Density Category c: The breasts are heterogeneously dense, which may obscure small masses.   FINDINGS: Right:   Mammogram: Additional views of the asymmetry and calcifications in the right lower inner breast were obtained. Group of pleomorphic calcifications are seen in the lower inner right breast, posterior depth, spanning approximately 1.5 cm. There is an associated irregular asymmetry associated with the calcifications.   Ultrasound: Targeted sonographic evaluation of the right breast demonstrates an irregular mass with angular margins measuring 1.9 x 0.6 x 0.5 cm, at 3 o'clock 12 CMFN, which corresponds to the mammographic abnormality. Calcifications are seen associated with the mass. No enlarged right axillary lymph nodes are identified.   IMPRESSION: Indeterminate asymmetry with calcifications in the lower inner right breast. Recommend further evaluation with ultrasound-guided core needle biopsy.   RECOMMENDATION: Ultrasound-guided core needle biopsy of right lower inner breast mass.   I have discussed the findings and recommendations with the patient. The biopsy procedure was explained to the patient and questions were answered. Patient expressed their understanding of the biopsy recommendation.   Patient will be scheduled for biopsy at her earliest convenience by the schedulers.   Ordering provider will be notified. If applicable, a reminder letter will be sent to the patient  regarding the next appointment.   BI-RADS CATEGORY  4: Suspicious.   MM RT RADIOACTIVE SEED LOC MAMMO GUIDE Result Date: 08/02/2024  CLINICAL DATA:  68 year old female presenting for seed localization of the right breast. Patient has newly diagnosed right breast intraductal papilloma with atypia. EXAM: MAMMOGRAPHIC GUIDED RADIOACTIVE SEED LOCALIZATION OF THE RIGHT BREAST COMPARISON:  Previous exam(s). FINDINGS: Patient presents for radioactive seed  localization prior to right breast excision. I met with the patient and we discussed the procedure of seed localization including benefits and alternatives. We discussed the high likelihood of a successful procedure. We discussed the risks of the procedure including infection, bleeding, tissue injury and further surgery. We discussed the low dose of radioactivity involved in the procedure. Informed, written consent was given. The usual time-out protocol was performed immediately prior to the procedure. Using mammographic guidance, sterile technique, 1% lidocaine  and an I-125 radioactive seed, the ribbon biopsy marking clip in the right breast was localized using a medial approach. The follow-up mammogram images confirm the seed in the expected location and were marked for Dr. Curvin. Follow-up survey of the patient confirms presence of the radioactive seed. Order number of I-125 seed:  [id number]. Total activity: 0.241 mCi reference Date: 07/11/2024 The patient tolerated the procedure well and was [facility]. She was given instructions regarding seed removal. IMPRESSION: Radioactive seed localization right breast. No apparent complications. Electronically Signed   By: Inocente Ast M.D.   On: 08/02/2024 12:12    DIGITAL SCREENING BILATERAL MAMMOGRAM WITH TOMOSYNTHESIS AND CAD 04/01/24:   TECHNIQUE: Bilateral screening digital craniocaudal and mediolateral oblique mammograms were obtained. Bilateral screening digital breast tomosynthesis  was performed. The images were evaluated with computer-aided detection.   COMPARISON:  Previous exam(s).   ACR Breast Density Category c: The breasts are heterogeneously dense, which may obscure small masses.   FINDINGS: In the right breast, a possible asymmetry with calcifications warrants further evaluation. In the left breast, no findings suspicious for malignancy.   IMPRESSION: Further evaluation is suggested for possible asymmetry with calcifications in the right breast.   RECOMMENDATION: Diagnostic mammogram and possibly ultrasound of the right breast. (Code:FI-R-35M)   The patient will be contacted regarding the findings, and additional imaging will be scheduled.   BI-RADS CATEGORY  0: Incomplete: Need additional imaging evaluation.   Pathology:  R breast re-excision 08/19/24:  FINAL MICROSCOPIC DIAGNOSIS:   A. BREAST, RIGHT MEDIAL MARGIN #1, RE-EXCISION:  - Benign breast tissue with focal usual ductal hyperplasia and surgical  site changes.  - Negative for atypia or malignancy.   B. BREAST, RIGHT MEDIAL MARGIN #2, RE-EXCISION:  - Benign fibroadipose tissue with no specific pathologic change.   C. BREAST, RIGHT SUPERIOR MARGIN, RE-EXCISION:  - Benign breast tissue with residual intraductal papilloma, completely  excised.  - Adjacent usual ductal hyperplasia involving sclerosing adenosis.  - Columnar cell change and surgical site changes.  - Negative for atypia or malignancy.    Surgical pathology, R lumpectomy 08/05/24:  CASE: [id number]  Clinical History: right breast ADH (cm)  FINAL MICROSCOPIC DIAGNOSIS:  A. BREAST, RIGHT, LUMPECTOMY:      Ductal carcinoma in situ, cribriform type, intermediate nuclear grade, with necrosis, arising in intraductal papillary with atypical intraductal hyperplasia (ADH) DCIS greatest dimension: 1.9 cm Margins:  Positive      Closest margin: Medial margin is positive                  0.1 mm from superior  margin Prognostic markers: ER and PR will be reported in an addendum Biopsy site and clip (ribbon shaped) identified Other findings: None See oncology table  ONCOLOGY TABLE:  DCIS OF THE BREAST:  Resection Procedure: Lumpectomy Specimen Laterality: Right Histologic Type: Ductal carcinoma in situ Size of DCIS: 1.9 cm Nuclear Grade: Intermediate grade Necrosis: Identified Margins: Positive      Specify Closest Margin (required only if <36mm):  Medial margin is positive  0.1 mm from superior margin Regional Lymph Nodes: Not applicable (no lymph nodes submitted or found)      Number of Lymph Nodes Examined: 0      Number of Sentinel Nodes Examined (if applicable): 0      Number of Lymph Nodes with Macrometastases: NA      Number of Lymph Nodes with Micrometastases): NA      Number of Lymph Nodes with Isolated Tumor Cells (=0.2 mm or =200 cells): NA      Extranodal Extension: Not identified Breast Biomarker Testing Performed on Previous Biopsy:      Testing performed on Case Number: Will be performed      Estrogen Receptor and progesterone Receptor will performed and will be reported in an addendum Pathologic Stage Classification (pTNM, AJCC 8th Edition): pTis, pNx  Specimen type: Received fresh is a clinically right lumpectomy specimen with a radiographic seed that is identified and removed.  The specimen is placed in formalin at 10:26 AM on 08/05/2024.  (CIT less than 1 hour) Size: 5.2 x 4.5 x 2.0 cm (M-L x A-P x S-I) Orientation: Anterior = green, posterior = black, superior = red, inferior = blue, medial = yellow, and lateral = orange Cut surface: The specimen is sectioned from medial to lateral and a Faxitron image is taken revealing a ribbon clip within a white-tan, firm, poorly defined, fibrotic area measuring 1.6 x 1.0 x 0.7 cm. Margins: Lesion abuts the medial margin, is 0.5 cm from the anterior margin, 1.0 cm from the posterior margin, 0.9 cm from the anterior margin,  1.0 cm from the superior margin, 1.2 cm from the inferior margin, and 4.0 cm from the lateral margin Block summary: A1 central lesion at clip site A2 lesion to medial margin A3 anterior margin closest to lesion A4 posterior margin closest to lesion A5-A6 remaining lesion to medial margin, blocked out A7 inferior and superior margins closest to lesion A8 lateral margin (KW, 08/05/2024)  Final Diagnosis performed by Pepper Dutton, MD.     ADDENDUM: PROGNOSTIC INDICATOR RESULTS:  Immunohistochemical and morphometric analysis performed manually.  Estrogen Receptor:       POSITIVE, 95%, MODERATE TO STRONG STAINING Progesterone Receptor:   POSITIVE, 95%, MODERATE TO STRONG STAINING  Reference Range Estrogen and Progesterone Receptor      Negative  0%      Positive  >1%  All controls stained appropriately.    IMPRESSION/PLAN:  Ms. Koo has a recent diagnosis of breast cancer, clinically stage 0, pTis. She is being seen today post-operatively. Imaging and pathology have been reviewed.   Plan:  We discussed the diagnosis of breast cancer and how radiation therapy can help reduce the risk of cancer returning. We reviewed the various radiation therapy options. We discussed the literature as it relates to omission of radiation in favorable risk DCIS and the differences in recurrence with omission.   We discussed the logistics of radiation and reviewed potential side effects of radiation for breast cancer.  The patient verbalized understanding. She would like to omit radiation in favor of treatment with endocrine therapy alone. I will make sure Dr. Lanny is aware of her decision so she may make the necessary arrangements.  Total time spent today in preparation for this visit was 60 minutes. This included patient care, imaging and path review, documentation, multidisciplinary discussion and coordination of care and follow up.    Estefana HERO. Maritza, M.D.

## 2024-08-15 NOTE — Telephone Encounter (Signed)
 Able to reach pt who requested delay in consultation due to work prior to upcoming sx. Pt was explained typical process of scheduling (CON prior to sx, FUN & SIM after sx) so to keep that in mind if we call to offer FUN/SIM soon after initial consult; pt verbalized understanding. Consult scheduled 8/28 @ 10:30am with Dr. Maritza.

## 2024-08-15 NOTE — Telephone Encounter (Signed)
 LVM for pt to schedule CON with Dr. Maritza.

## 2024-08-15 NOTE — Telephone Encounter (Signed)
 Received VM from pt advising limited availability and that she will c/b after 12:30pm.

## 2024-08-16 ENCOUNTER — Inpatient Hospital Stay: Admitting: Hematology

## 2024-08-16 ENCOUNTER — Inpatient Hospital Stay: Attending: Hematology

## 2024-08-16 ENCOUNTER — Encounter: Payer: Self-pay | Admitting: Hematology

## 2024-08-16 VITALS — BP 141/81 | HR 90 | Temp 97.6°F | Resp 18 | Ht 63.0 in | Wt 210.3 lb

## 2024-08-16 DIAGNOSIS — Z1721 Progesterone receptor positive status: Secondary | ICD-10-CM | POA: Diagnosis not present

## 2024-08-16 DIAGNOSIS — D0511 Intraductal carcinoma in situ of right breast: Secondary | ICD-10-CM

## 2024-08-16 DIAGNOSIS — R9230 Dense breasts, unspecified: Secondary | ICD-10-CM | POA: Diagnosis not present

## 2024-08-16 DIAGNOSIS — Z170 Estrogen receptor positive status [ER+]: Secondary | ICD-10-CM | POA: Diagnosis not present

## 2024-08-16 DIAGNOSIS — N6091 Unspecified benign mammary dysplasia of right breast: Secondary | ICD-10-CM

## 2024-08-16 LAB — CBC WITH DIFFERENTIAL (CANCER CENTER ONLY)
Abs Immature Granulocytes: 0.02 K/uL (ref 0.00–0.07)
Basophils Absolute: 0.1 K/uL (ref 0.0–0.1)
Basophils Relative: 1 %
Eosinophils Absolute: 0.2 K/uL (ref 0.0–0.5)
Eosinophils Relative: 2 %
HCT: 39.5 % (ref 36.0–46.0)
Hemoglobin: 13.2 g/dL (ref 12.0–15.0)
Immature Granulocytes: 0 %
Lymphocytes Relative: 27 %
Lymphs Abs: 2.5 K/uL (ref 0.7–4.0)
MCH: 29.3 pg (ref 26.0–34.0)
MCHC: 33.4 g/dL (ref 30.0–36.0)
MCV: 87.6 fL (ref 80.0–100.0)
Monocytes Absolute: 0.5 K/uL (ref 0.1–1.0)
Monocytes Relative: 6 %
Neutro Abs: 5.9 K/uL (ref 1.7–7.7)
Neutrophils Relative %: 64 %
Platelet Count: 273 K/uL (ref 150–400)
RBC: 4.51 MIL/uL (ref 3.87–5.11)
RDW: 12.5 % (ref 11.5–15.5)
WBC Count: 9.2 K/uL (ref 4.0–10.5)
nRBC: 0 % (ref 0.0–0.2)

## 2024-08-16 LAB — CMP (CANCER CENTER ONLY)
ALT: 29 U/L (ref 0–44)
AST: 16 U/L (ref 15–41)
Albumin: 4.3 g/dL (ref 3.5–5.0)
Alkaline Phosphatase: 50 U/L (ref 38–126)
Anion gap: 9 (ref 5–15)
BUN: 14 mg/dL (ref 8–23)
CO2: 27 mmol/L (ref 22–32)
Calcium: 9.1 mg/dL (ref 8.9–10.3)
Chloride: 103 mmol/L (ref 98–111)
Creatinine: 0.62 mg/dL (ref 0.44–1.00)
GFR, Estimated: >60 mL/min (ref >60)
Glucose, Bld: 174 mg/dL — ABNORMAL HIGH (ref 70–99)
Potassium: 4.2 mmol/L (ref 3.5–5.1)
Sodium: 139 mmol/L (ref 135–145)
Total Bilirubin: 0.5 mg/dL (ref 0.0–1.2)
Total Protein: 7 g/dL (ref 6.5–8.1)

## 2024-08-16 MED ORDER — CHLORHEXIDINE GLUCONATE CLOTH 2 % EX PADS: 6.0000 | MEDICATED_PAD | Freq: Once | CUTANEOUS | Status: DC

## 2024-08-16 NOTE — Progress Notes (Signed)
 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 -She underwent lumpectomy on August 05, 2024, which showed intermediate grade DCIS, ER and PR strongly positive, margin was positive.  She is scheduled to have second surgery on August 22  Assessment & Plan Ductal carcinoma in situ of right breast Ductal carcinoma in situ (DCIS) of the left breast was identified post-surgery, following an initial biopsy indicating atypical ductal hyperplasia (ADH). The DCIS is noninvasive and will be cured after surgery. The risk of future breast cancer in the same breast is high, with a 14% risk in the next 10 years and a 20% or higher risk over her lifetime. Radiation therapy and tamoxifen are options to reduce this risk. Radiation can reduce the risk by 60%, while tamoxifen can reduce it by 50%. If both treatments are used, the risk can be reduced to 2-3%. - Perform second surgery to clear margins. - Discuss radiation therapy with radiologist, noting it involves daily sessions for four weeks and potential side effects such as breast firmness and size reduction. - Start tamoxifen 5 mg daily for 3 years after healing from surgery. - Monitor for side effects of tamoxifen, including hot flashes and weight gain. - Encourage healthy diet and exercise to manage weight and cholesterol.  Dense breast tissue Dense breast tissue increases the risk of breast cancer and can make mammograms less effective. Additional screening with contrast-enhanced mammograms and breast MRI is  recommended to improve early detection of any recurrence or new cancer. - Order screening mammogram with contrast in April 2026. - Order breast MRI in October 2026. - Alternate mammogram and MRI every 6 months.  Plan - Surgical path reviewed, due to the positive margin, she is scheduled for second surgery on August 22. - Discussed option of adjuvant radiation and low-dose tamoxifen for 3 years, she prefer not to have radiation, but she is interested in tamoxifen.  She will call me after her consultation with radiation oncology.   SUMMARY OF ONCOLOGIC HISTORY: Oncology History  Ductal carcinoma in situ (DCIS) of right breast  06/24/2024 Initial Diagnosis   Ductal carcinoma in situ (DCIS) of right breast   08/05/2024 Cancer Staging   Staging form: Breast, AJCC 8th Edition - Clinical stage from 08/05/2024: Stage 0 (cTis (DCIS), cN0, cM0, G2, ER+, PR+, HER2: Not Assessed) - Signed by Lanny Callander, MD on 08/16/2024 Stage prefix: Initial diagnosis Histologic grading system: 3 grade system      Discussed the use of AI scribe software for clinical note transcription with the patient, who gave verbal consent to proceed.  History of Present Illness Jenny Maldonado is a 68 year old female with ductal carcinoma in situ (DCIS) who presents for follow-up after surgery.  She underwent surgery for DCIS after an initial biopsy showed atypical ductal hyperplasia. The surgery revealed DCIS with positive margins, requiring a follow-up procedure. She has had two incisions to date.  The initial diagnosis followed a mammogram indicating intermediate-grade DCIS with necrosis. She is considering tamoxifen and radiation therapy to reduce future breast cancer risk, with a  preference for tamoxifen.  There is no family history of breast cancer. Dense breast tissue may necessitate additional screening, such as a mammogram with contrast and breast MRI.     All other systems were reviewed with the patient and are  negative.  MEDICAL HISTORY:  Past Medical History:  Diagnosis Date   Anxiety    Bladder polyps    Depression    Diabetes mellitus without complication (HCC)    Elevated liver function tests    Hyperlipidemia    Hypertension    Vitamin D  deficiency     SURGICAL HISTORY: Past Surgical History:  Procedure Laterality Date   BREAST BIOPSY Right 05/06/2024   US  RT BREAST BX W LOC DEV 1ST LESION IMG BX SPEC US  GUIDE 05/06/2024 [facility]   BREAST BIOPSY  08/02/2024   MM RT RADIOACTIVE SEED LOC MAMMO GUIDE 08/02/2024 [facility]   BREAST LUMPECTOMY WITH RADIOACTIVE SEED LOCALIZATION Right 08/05/2024   Procedure: BREAST LUMPECTOMY WITH RADIOACTIVE SEED LOCALIZATION;  Surgeon: Curvin Deward MOULD, MD;  Location: [facility];  Service: General;  Laterality: Right;    I have reviewed the social history and family history with the patient and they are unchanged from previous note.  ALLERGIES:  is allergic to other, macrobid [nitrofurantoin monohyd macro], and codeine sulfate.  MEDICATIONS:  Current Outpatient Medications  Medication Sig Dispense Refill   aspirin 81 MG tablet Take 81 mg by mouth daily.     cholecalciferol (VITAMIN D ) 1000 UNITS tablet Take 5,000 Units by mouth daily.      escitalopram (LEXAPRO) 10 MG tablet Take 10 mg by mouth daily.     glimepiride (AMARYL) 1 MG tablet Take 1 mg by mouth daily with breakfast.     glucose blood test strip 1 each by Other route daily. Use as instructed     hydrochlorothiazide (HYDRODIURIL) 25 MG tablet Take 25 mg by mouth daily.     lisinopril (PRINIVIL,ZESTRIL) 10 MG tablet Take 20 mg by mouth daily.      metFORMIN (GLUCOPHAGE) 500 MG tablet Take 500 mg by mouth 2 (two) times daily with a meal.     ONETOUCH DELICA LANCETS FINE MISC by Does not apply route daily.     traMADol  (ULTRAM ) 50 MG tablet Take 1 tablet (50 mg total) by mouth every 6 (six) hours as needed. 10 tablet 0   No current facility-administered medications  for this visit.   Facility-Administered Medications Ordered in Other Visits  Medication Dose Route Frequency Provider Last Rate Last Admin   Chlorhexidine  Gluconate Cloth 2 % PADS 6 each  6 each Topical Once Curvin Deward MOULD, MD       And   Chlorhexidine  Gluconate Cloth 2 % PADS 6 each  6 each Topical Once Curvin Deward III, MD        PHYSICAL EXAMINATION: ECOG PERFORMANCE STATUS: 0 - Asymptomatic  Vitals:   08/16/24 0903 08/16/24 0904  BP: (!) 141/83 (!) 141/81  Pulse: 90   Resp: 18   Temp: 97.6 F (36.4 C)   SpO2: 98%    Wt Readings from Last 3 Encounters:  08/16/24 210 lb 5 oz (95.4 kg)  08/05/24 207 lb 14.3 oz (94.3 kg)  06/24/24 212 lb 9.6 oz (96.4 kg)     GENERAL:alert, no distress and comfortable SKIN: skin color, texture, turgor are normal, no rashes or significant lesions EYES: normal, Conjunctiva are pink and non-injected, sclera clear  Musculoskeletal:no cyanosis of digits and no clubbing  NEURO: alert &  oriented x 3 with fluent speech, no focal motor/sensory deficits  Physical Exam    LABORATORY DATA:  I have reviewed the data as listed    Latest Ref Rng & Units 08/16/2024    8:43 AM  CBC  WBC 4.0 - 10.5 K/uL 9.2   Hemoglobin 12.0 - 15.0 g/dL 86.7   Hematocrit 63.9 - 46.0 % 39.5   Platelets 150 - 400 K/uL 273         Latest Ref Rng & Units 08/16/2024    8:43 AM 08/02/2024   11:00 AM  CMP  Glucose 70 - 99 mg/dL 825  830   BUN 8 - 23 mg/dL 14  15   Creatinine 9.55 - 1.00 mg/dL 9.37  9.36   Sodium 864 - 145 mmol/L 139  140   Potassium 3.5 - 5.1 mmol/L 4.2  4.6   Chloride 98 - 111 mmol/L 103  101   CO2 22 - 32 mmol/L 27  27   Calcium 8.9 - 10.3 mg/dL 9.1  9.3   Total Protein 6.5 - 8.1 g/dL 7.0    Total Bilirubin 0.0 - 1.2 mg/dL 0.5    Alkaline Phos 38 - 126 U/L 50    AST 15 - 41 U/L 16    ALT 0 - 44 U/L 29        RADIOGRAPHIC STUDIES: I have personally reviewed the radiological images as listed and agreed with the findings in the report. No  results found.    No orders of the defined types were placed in this encounter.  All questions were answered. The patient knows to call the clinic with any problems, questions or concerns. No barriers to learning was detected. The total time spent in the appointment was 30 minutes, including review of chart and various tests results, discussions about plan of care and coordination of care plan     Onita Mattock, MD 08/16/2024

## 2024-08-16 NOTE — Assessment & Plan Note (Signed)
-  ER+/PR+ - Screening mammogram in April 2025 showed 1.9 cm calcification in the right breast 3 o'clock position, biopsy showed ADH. -She underwent lumpectomy on August 05, 2024, which showed intermediate grade DCIS, ER and PR strongly positive, margin was positive.  She is scheduled to have second surgery on August 22

## 2024-08-16 NOTE — Progress Notes (Signed)

## 2024-08-19 ENCOUNTER — Ambulatory Visit (HOSPITAL_BASED_OUTPATIENT_CLINIC_OR_DEPARTMENT_OTHER): Admitting: Anesthesiology

## 2024-08-19 ENCOUNTER — Encounter (HOSPITAL_BASED_OUTPATIENT_CLINIC_OR_DEPARTMENT_OTHER): Payer: Self-pay | Admitting: General Surgery

## 2024-08-19 ENCOUNTER — Other Ambulatory Visit: Payer: Self-pay

## 2024-08-19 ENCOUNTER — Encounter: Payer: Self-pay | Admitting: *Deleted

## 2024-08-19 ENCOUNTER — Encounter (HOSPITAL_BASED_OUTPATIENT_CLINIC_OR_DEPARTMENT_OTHER)
Admission: RE | Disposition: A | Discharge status: Home / Self Care | Payer: Self-pay | Source: Home / Self Care | Attending: General Surgery

## 2024-08-19 ENCOUNTER — Ambulatory Visit (HOSPITAL_BASED_OUTPATIENT_CLINIC_OR_DEPARTMENT_OTHER)
Admission: RE | Admit: 2024-08-19 | Discharge: 2024-08-19 | Disposition: A | Discharge status: Home / Self Care | Attending: General Surgery | Admitting: General Surgery

## 2024-08-19 DIAGNOSIS — Z7984 Long term (current) use of oral hypoglycemic drugs: Secondary | ICD-10-CM | POA: Insufficient documentation

## 2024-08-19 DIAGNOSIS — I10 Essential (primary) hypertension: Secondary | ICD-10-CM | POA: Insufficient documentation

## 2024-08-19 DIAGNOSIS — E119 Type 2 diabetes mellitus without complications: Secondary | ICD-10-CM | POA: Insufficient documentation

## 2024-08-19 DIAGNOSIS — N6091 Unspecified benign mammary dysplasia of right breast: Secondary | ICD-10-CM | POA: Diagnosis not present

## 2024-08-19 DIAGNOSIS — Z87891 Personal history of nicotine dependence: Secondary | ICD-10-CM

## 2024-08-19 DIAGNOSIS — E1165 Type 2 diabetes mellitus with hyperglycemia: Secondary | ICD-10-CM

## 2024-08-19 DIAGNOSIS — D0511 Intraductal carcinoma in situ of right breast: Secondary | ICD-10-CM | POA: Insufficient documentation

## 2024-08-19 DIAGNOSIS — F418 Other specified anxiety disorders: Secondary | ICD-10-CM | POA: Diagnosis not present

## 2024-08-19 HISTORY — PX: RE-EXCISION OF BREAST LUMPECTOMY: SHX6048

## 2024-08-19 LAB — GLUCOSE, CAPILLARY
Glucose-Capillary: 159 mg/dL — ABNORMAL HIGH (ref 70–99)
Glucose-Capillary: 151 mg/dL — ABNORMAL HIGH (ref 70–99)

## 2024-08-19 SURGERY — EXCISION, LESION, BREAST
Anesthesia: General | Site: Breast | Laterality: Right

## 2024-08-19 MED ORDER — ACETAMINOPHEN 500 MG PO TABS
1000.0000 mg | ORAL_TABLET | ORAL | Status: AC
  Administered 2024-08-19: 1000 mg via ORAL

## 2024-08-19 MED ORDER — LIDOCAINE HCL (CARDIAC) PF 100 MG/5ML IV SOSY
PREFILLED_SYRINGE | INTRAVENOUS | Status: DC | PRN
  Administered 2024-08-19: 50 mg via INTRAVENOUS

## 2024-08-19 MED ORDER — PROPOFOL 10 MG/ML IV BOLUS
INTRAVENOUS | Status: AC
  Filled 2024-08-19: qty 20

## 2024-08-19 MED ORDER — EPHEDRINE SULFATE (PRESSORS) 50 MG/ML IJ SOLN
INTRAMUSCULAR | Status: DC | PRN
  Administered 2024-08-19: 5 mg via INTRAVENOUS

## 2024-08-19 MED ORDER — ONDANSETRON HCL 4 MG/2ML IJ SOLN
INTRAMUSCULAR | Status: AC
  Filled 2024-08-19: qty 2

## 2024-08-19 MED ORDER — ONDANSETRON HCL 4 MG/2ML IJ SOLN
INTRAMUSCULAR | Status: DC | PRN
  Administered 2024-08-19: 4 mg via INTRAVENOUS

## 2024-08-19 MED ORDER — FENTANYL CITRATE (PF) 100 MCG/2ML IJ SOLN
INTRAMUSCULAR | Status: AC
Start: 2024-08-19 — End: 2024-08-19
  Filled 2024-08-19: qty 2

## 2024-08-19 MED ORDER — TRAMADOL HCL 50 MG PO TABS
50.0000 mg | ORAL_TABLET | Freq: Four times a day (QID) | ORAL | 0 refills | Status: DC | PRN
Start: 2024-08-19 — End: 2024-10-17

## 2024-08-19 MED ORDER — BUPIVACAINE-EPINEPHRINE 0.25% -1:200000 IJ SOLN
INTRAMUSCULAR | Status: DC | PRN
  Administered 2024-08-19: 20 mL

## 2024-08-19 MED ORDER — ACETAMINOPHEN 500 MG PO TABS
ORAL_TABLET | ORAL | Status: AC
  Filled 2024-08-19: qty 2

## 2024-08-19 MED ORDER — DEXAMETHASONE SODIUM PHOSPHATE 10 MG/ML IJ SOLN
INTRAMUSCULAR | Status: AC
  Filled 2024-08-19: qty 1

## 2024-08-19 MED ORDER — LIDOCAINE 2% (20 MG/ML) 5 ML SYRINGE
INTRAMUSCULAR | Status: AC
  Filled 2024-08-19: qty 5

## 2024-08-19 MED ORDER — DEXMEDETOMIDINE HCL IN NACL 200 MCG/50ML IV SOLN
INTRAVENOUS | Status: DC | PRN
  Administered 2024-08-19: 8 ug via INTRAVENOUS

## 2024-08-19 MED ORDER — MIDAZOLAM HCL 2 MG/2ML IJ SOLN
INTRAMUSCULAR | Status: AC
Start: 2024-08-19 — End: 2024-08-19
  Filled 2024-08-19: qty 2

## 2024-08-19 MED ORDER — GABAPENTIN 100 MG PO CAPS
ORAL_CAPSULE | ORAL | Status: AC
  Filled 2024-08-19: qty 1

## 2024-08-19 MED ORDER — GABAPENTIN 100 MG PO CAPS
100.0000 mg | ORAL_CAPSULE | ORAL | Status: AC
  Administered 2024-08-19: 100 mg via ORAL

## 2024-08-19 MED ORDER — FENTANYL CITRATE (PF) 100 MCG/2ML IJ SOLN
INTRAMUSCULAR | Status: DC | PRN
  Administered 2024-08-19: 50 ug via INTRAVENOUS
  Administered 2024-08-19 (×2): 25 ug via INTRAVENOUS

## 2024-08-19 MED ORDER — LACTATED RINGERS IV SOLN: INTRAVENOUS | Status: DC

## 2024-08-19 MED ORDER — FENTANYL CITRATE (PF) 100 MCG/2ML IJ SOLN
25.0000 ug | INTRAMUSCULAR | Status: DC | PRN
  Administered 2024-08-19: 25 ug via INTRAVENOUS
  Administered 2024-08-19: 50 ug via INTRAVENOUS
  Administered 2024-08-19: 25 ug via INTRAVENOUS

## 2024-08-19 MED ORDER — PROPOFOL 10 MG/ML IV BOLUS
INTRAVENOUS | Status: DC | PRN
  Administered 2024-08-19: 170 mg via INTRAVENOUS
  Administered 2024-08-19: 30 mg via INTRAVENOUS

## 2024-08-19 MED ORDER — ONDANSETRON HCL 4 MG/2ML IJ SOLN: 4.0000 mg | Freq: Once | INTRAMUSCULAR | Status: DC | PRN

## 2024-08-19 MED ORDER — MIDAZOLAM HCL 5 MG/5ML IJ SOLN
INTRAMUSCULAR | Status: DC | PRN
  Administered 2024-08-19 (×2): 1 mg via INTRAVENOUS

## 2024-08-19 MED ORDER — CEFAZOLIN SODIUM-DEXTROSE 2-4 GM/100ML-% IV SOLN
INTRAVENOUS | Status: AC
Start: 2024-08-19 — End: 2024-08-19
  Filled 2024-08-19: qty 100

## 2024-08-19 MED ORDER — AMISULPRIDE (ANTIEMETIC) 5 MG/2ML IV SOLN: 10.0000 mg | Freq: Once | INTRAVENOUS | Status: DC | PRN

## 2024-08-19 MED ORDER — CEFAZOLIN SODIUM-DEXTROSE 2-4 GM/100ML-% IV SOLN
2.0000 g | INTRAVENOUS | Status: AC
  Administered 2024-08-19: 2 g via INTRAVENOUS

## 2024-08-19 SURGICAL SUPPLY — 34 items
BLADE SURG 15 STRL LF DISP TIS (BLADE) ×1 IMPLANT
CANISTER SUCT 1200ML W/VALVE (MISCELLANEOUS) ×1 IMPLANT
CHLORAPREP W/TINT 26 (MISCELLANEOUS) ×1 IMPLANT
CLIP APPLIE 9.375 MED OPEN (MISCELLANEOUS) IMPLANT
COVER BACK TABLE 60X90IN (DRAPES) ×1 IMPLANT
COVER MAYO STAND STRL (DRAPES) ×1 IMPLANT
DERMABOND ADVANCED .7 DNX12 (GAUZE/BANDAGES/DRESSINGS) ×1 IMPLANT
DRAPE LAPAROSCOPIC ABDOMINAL (DRAPES) ×1 IMPLANT
DRAPE UTILITY XL STRL (DRAPES) ×1 IMPLANT
DURAPREP 6ML APPLICATOR 50/CS (WOUND CARE) IMPLANT
ELECT COATED BLADE 2.86 ST (ELECTRODE) ×1 IMPLANT
ELECTRODE REM PT RTRN 9FT ADLT (ELECTROSURGICAL) ×1 IMPLANT
GLOVE BIO SURGEON STRL SZ7.5 (GLOVE) ×1 IMPLANT
GLOVE BIOGEL PI IND STRL 7.0 (GLOVE) IMPLANT
GLOVE SURG SS PI 7.0 STRL IVOR (GLOVE) IMPLANT
GOWN STRL REUS W/ TWL LRG LVL3 (GOWN DISPOSABLE) ×2 IMPLANT
KIT MARKER MARGIN INK (KITS) IMPLANT
NDL HYPO 25X1 1.5 SAFETY (NEEDLE) ×1 IMPLANT
NEEDLE HYPO 25X1 1.5 SAFETY (NEEDLE) ×1 IMPLANT
NS IRRIG 1000ML POUR BTL (IV SOLUTION) IMPLANT
PACK BASIN DAY SURGERY FS (CUSTOM PROCEDURE TRAY) ×1 IMPLANT
PENCIL SMOKE EVACUATOR (MISCELLANEOUS) ×1 IMPLANT
SLEEVE SCD COMPRESS KNEE MED (STOCKING) ×1 IMPLANT
SPIKE FLUID TRANSFER (MISCELLANEOUS) ×1 IMPLANT
SPONGE T-LAP 18X18 ~~LOC~~+RFID (SPONGE) ×1 IMPLANT
STAPLER SKIN PROX WIDE 3.9 (STAPLE) IMPLANT
SUT MON AB 4-0 PC3 18 (SUTURE) ×1 IMPLANT
SUT SILK 2 0 SH (SUTURE) IMPLANT
SUT VICRYL 3-0 CR8 SH (SUTURE) ×1 IMPLANT
SYR CONTROL 10ML LL (SYRINGE) ×1 IMPLANT
TOWEL GREEN STERILE FF (TOWEL DISPOSABLE) ×1 IMPLANT
TRAY FAXITRON CT DISP (TRAY / TRAY PROCEDURE) IMPLANT
TUBE CONNECTING 20X1/4 (TUBING) ×1 IMPLANT
YANKAUER SUCT BULB TIP NO VENT (SUCTIONS) ×1 IMPLANT

## 2024-08-19 NOTE — Discharge Instructions (Signed)
 No Tylenol  before 3:30pm today.   Post Anesthesia Home Care Instructions  Activity: Get plenty of rest for the remainder of the day. A responsible individual must stay with you for 24 hours following the procedure.  For the next 24 hours, DO NOT: -Drive a car -Advertising copywriter -Drink alcoholic beverages -Take any medication unless instructed by your physician -Make any legal decisions or sign important papers.  Meals: Start with liquid foods such as gelatin or soup. Progress to regular foods as tolerated. Avoid greasy, spicy, heavy foods. If nausea and/or vomiting occur, drink only clear liquids until the nausea and/or vomiting subsides. Call your physician if vomiting continues.  Special Instructions/Symptoms: Your throat may feel dry or sore from the anesthesia or the breathing tube placed in your throat during surgery. If this causes discomfort, gargle with warm salt water. The discomfort should disappear within 24 hours.  If you had a scopolamine patch placed behind your ear for the management of post- operative nausea and/or vomiting:  1. The medication in the patch is effective for 72 hours, after which it should be removed.  Wrap patch in a tissue and discard in the trash. Wash hands thoroughly with soap and water. 2. You may remove the patch earlier than 72 hours if you experience unpleasant side effects which may include dry mouth, dizziness or visual disturbances. 3. Avoid touching the patch. Wash your hands with soap and water after contact with the patch.

## 2024-08-19 NOTE — Interval H&P Note (Signed)
 History and Physical Interval Note:  08/19/2024 10:48 AM  Jenny Maldonado  has presented today for surgery, with the diagnosis of RIGHT BREAST DCIS.  The various methods of treatment have been discussed with the patient and family. After consideration of risks, benefits and other options for treatment, the patient has consented to  Procedure(s) with comments: EXCISION, LESION, BREAST (Right) - RE-EXCISION RIGHT BREAST MARGINS as a surgical intervention.  The patient's history has been reviewed, patient examined, no change in status, stable for surgery.  I have reviewed the patient's chart and labs.  Questions were answered to the patient's satisfaction.     Deward Null III

## 2024-08-19 NOTE — Anesthesia Postprocedure Evaluation (Signed)
 Anesthesia Post Note  Patient: Jenny Maldonado  Procedure(s) Performed: RE-EXCISION RIGHT BREAST MARGINS (Right: Breast)     Patient location during evaluation: PACU Anesthesia Type: General Level of consciousness: awake Pain management: pain level controlled Vital Signs Assessment: post-procedure vital signs reviewed and stable Respiratory status: spontaneous breathing, nonlabored ventilation and respiratory function stable Cardiovascular status: blood pressure returned to baseline and stable Postop Assessment: no apparent nausea or vomiting Anesthetic complications: no   No notable events documented.  Last Vitals:  Vitals:   08/19/24 1305 08/19/24 1320  BP:  133/71  Pulse:  75  Resp: 12 16  Temp:  (!) 36.2 C  SpO2: 97% 97%    Last Pain:  Vitals:   08/19/24 1320  TempSrc:   PainSc: 3                  Angline Schweigert P Bradee Common

## 2024-08-19 NOTE — Anesthesia Procedure Notes (Signed)
 Procedure Name: LMA Insertion Date/Time: 08/19/2024 11:23 AM  Performed by: Kathern Rollene LABOR, CRNAPre-anesthesia Checklist: Patient identified, Emergency Drugs available, Suction available and Patient being monitored Patient Re-evaluated:Patient Re-evaluated prior to induction Oxygen Delivery Method: Circle system utilized Preoxygenation: Pre-oxygenation with 100% oxygen Induction Type: IV induction Ventilation: Mask ventilation without difficulty LMA: LMA inserted LMA Size: 4.0 Tube type: Oral Number of attempts: 1 Placement Confirmation: positive ETCO2 and breath sounds checked- equal and bilateral Tube secured with: Tape (paper tape) Dental Injury: Teeth and Oropharynx as per pre-operative assessment

## 2024-08-19 NOTE — Op Note (Signed)
 08/19/2024  11:57 AM  PATIENT:  Jenny Maldonado  68 y.o. female  PRE-OPERATIVE DIAGNOSIS:  RIGHT BREAST DCIS  POST-OPERATIVE DIAGNOSIS:  RIGHT BREAST DCIS  PROCEDURE:  Procedure(s) with comments: RE-EXCISION RIGHT BREAST MARGINS (Right) - RE-EXCISION RIGHT BREAST MARGINS  SURGEON:  Surgeons and Role:    * Curvin Deward MOULD, MD - Primary  PHYSICIAN ASSISTANT:   ASSISTANTS: none   ANESTHESIA:   local and general  EBL:  minimal   BLOOD ADMINISTERED:none  DRAINS: none   LOCAL MEDICATIONS USED:  MARCAINE      SPECIMEN:  Source of Specimen:  right breast superior margin and medial margin 1 and 2  DISPOSITION OF SPECIMEN:  PATHOLOGY  COUNTS:  YES  TOURNIQUET:  * No tourniquets in log *  DICTATION: .Dragon Dictation  After informed consent was obtained the patient was brought to the operating room and placed in the supine position on the operating table.  After adequate induction of general anesthesia the patient's right breast was prepped with DuraPrep, allowed to dry, and draped in usual sterile manner.  An appropriate timeout was performed.  The area around the previous incision was infiltrated with quarter percent Marcaine .  The previous visit incision was then opened sharply with a 15 blade knife.  The lumpectomy cavity was reopened sharply with the electrocautery.  I then removed an additional medial margin.  At the edge of the medial margin was a small hard area so I removed a second medial margin to include this area.  I also removed a superior margin sharply with the electrocautery.  All of the margins were then marked with the appropriate paint color.  The tissue was then sent to pathology for evaluation.  Hemostasis was achieved using the Bovie electrocautery.  The wound was irrigated with saline and infiltrated with more quarter percent Marcaine .  The cavity was marked with a clip.  The deep layer of the incision was then closed with interrupted 3-0 Vicryl stitches.  The skin was  closed with a running 4-0 Monocryl subcuticular stitch.  Dermabond dressings were applied.  The patient tolerated the procedure well.  At the end of the case all needle sponge and instrument counts were correct.  The patient was then awakened and taken to recovery in stable condition.  PLAN OF CARE: Discharge to home after PACU  PATIENT DISPOSITION:  PACU - hemodynamically stable.   Delay start of Pharmacological VTE agent (>24hrs) due to surgical blood loss or risk of bleeding: not applicable

## 2024-08-19 NOTE — Transfer of Care (Signed)
 Immediate Anesthesia Transfer of Care Note  Patient: Jenny Maldonado  Procedure(s) Performed: RE-EXCISION RIGHT BREAST MARGINS (Right: Breast)  Patient Location: PACU  Anesthesia Type:General  Level of Consciousness: awake, alert , oriented, and patient cooperative  Airway & Oxygen Therapy: Patient Spontanous Breathing and Patient connected to face mask oxygen  Post-op Assessment: Report given to RN and Post -op Vital signs reviewed and stable  Post vital signs: Reviewed and stable  Last Vitals:  Vitals Value Taken Time  BP 123/78 08/19/24 12:09  Temp    Pulse 81 08/19/24 12:12  Resp 18 08/19/24 12:12  SpO2 99 % 08/19/24 12:12  Vitals shown include unfiled device data.  Last Pain:  Vitals:   08/19/24 0928  TempSrc: Tympanic  PainSc: 0-No pain      Patients Stated Pain Goal: 7 (08/19/24 9071)  Complications: No notable events documented.

## 2024-08-19 NOTE — H&P (Signed)
 REFERRING PHYSICIAN: Pridgen, Taylar, NP PROVIDER: DEWARD GARNETTE NULL, MD MRN: [id number] DOB: 1956/11/28 Subjective    Chief Complaint: New Consultation  History of Present Illness: Jenny Maldonado is a 68 y.o. female who is seen today as an office consultation for evaluation of New Consultation  We are asked to see the patient in consultation by Dr. Almarie Das to evaluate her for right breast atypical ductal hyperplasia. The patient is a 68 year old white female who recently went for a routine screening mammogram. At that time she was found to have a 1.9 cm area of distortion in the lower inner quadrant of the right breast. 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 She denies any family history of breast cancer. She does have hypertension and diabetes but does not smoke. She did have a reaction to the chlorhexidine  prep from her biopsy  Review of Systems: A complete review of systems was obtained from the patient. I have reviewed this information and discussed as appropriate with the patient. See HPI as well for other ROS.  ROS   Medical History: Past Medical History:  Diagnosis Date  Anxiety  Diabetes mellitus without complication (CMS/HHS-HCC)  Hyperlipidemia  Hypertension   Patient Active Problem List  Diagnosis  Atypical ductal hyperplasia of right breast   History reviewed. No pertinent surgical history.   Allergies  Allergen Reactions  Other Other (See Comments)  BD ChloraPrep  Rash with blistering  Nitrofurantoin Monohyd/M-Cryst Hives  Codeine Sulfate Other (See Comments)  Upset stomach.   Current Outpatient Medications on File Prior to Visit  Medication Sig Dispense Refill  aspirin 81 MG chewable tablet 1 tablet Orally Once a day  atorvastatin (LIPITOR) 20 MG tablet TAKE 1 TABLET BY MOUTH EVERY DAY Orally Once a day  cholecalciferol (VITAMIN D3) 1000 unit tablet Take 5,000 Units by mouth once daily   escitalopram oxalate (LEXAPRO) 10 MG tablet Take 1 tablet by mouth once daily  FUROsemide (LASIX) 20 MG tablet Take 20 mg by mouth  glimepiride (AMARYL) 1 MG tablet Take 1 mg by mouth  hydroCHLOROthiazide (HYDRODIURIL) 25 MG tablet Take 25 mg by mouth once daily  lisinopriL (ZESTRIL) 20 MG tablet 1 tablet Orally Once a day for 90 days  metFORMIN (GLUCOPHAGE) 500 MG tablet Take 500 mg by mouth 2 (two) times daily with meals   No current facility-administered medications on file prior to visit.   Family History  Problem Relation Age of Onset  High blood pressure (Hypertension) Mother  Coronary Artery Disease (Blocked arteries around heart) Mother  Coronary Artery Disease (Blocked arteries around heart) Father  Diabetes Father  Obesity Sister  Coronary Artery Disease (Blocked arteries around heart) Sister    Social History   Tobacco Use  Smoking Status Never  Smokeless Tobacco Never    Social History   Socioeconomic History  Marital status: Married  Tobacco Use  Smoking status: Never  Smokeless tobacco: Never  Vaping Use  Vaping status: Never Used  Substance and Sexual Activity  Alcohol use: Not Currently  Drug use: Never   Social Drivers of Health   Received from [employer]  Social Network  Housing Stability: Unknown (05/22/2024)  Housing Stability Vital Sign  Homeless in the Last Year: No   Objective:   Vitals:  BP: 138/80  Pulse: 97  Temp: 36.7 C (98 F)  SpO2: 98%  Weight: 95.9 kg (211 lb 6.4 oz)  Height: 160 cm (5' 3)  PainSc: 0-No pain  PainLoc:  Breast   Body mass index is 37.45 kg/m.  Physical Exam Vitals reviewed.  Constitutional:  General: She is not in acute distress. Appearance: Normal appearance.  HENT:  Head: Normocephalic and atraumatic.  Right Ear: External ear normal.  Left Ear: External ear normal.  Nose: Nose normal.  Mouth/Throat:  Mouth: Mucous membranes are moist.  Pharynx: Oropharynx is clear.  Eyes:  General: No  scleral icterus. Extraocular Movements: Extraocular movements intact.  Conjunctiva/sclera: Conjunctivae normal.  Pupils: Pupils are equal, round, and reactive to light.  Cardiovascular:  Rate and Rhythm: Normal rate and regular rhythm.  Pulses: Normal pulses.  Heart sounds: Normal heart sounds.  Pulmonary:  Effort: Pulmonary effort is normal. No respiratory distress.  Breath sounds: Normal breath sounds.  Abdominal:  General: Bowel sounds are normal.  Palpations: Abdomen is soft.  Tenderness: There is no abdominal tenderness.  Musculoskeletal:  General: No swelling, tenderness or deformity. Normal range of motion.  Cervical back: Normal range of motion and neck supple.  Skin: General: Skin is warm and dry.  Coloration: Skin is not jaundiced.  Neurological:  General: No focal deficit present.  Mental Status: She is alert and oriented to person, place, and time.  Psychiatric:  Mood and Affect: Mood normal.  Behavior: Behavior normal.     Breast: There is no palpable mass in either breast. There is no palpable axillary, supraclavicular, or cervical lymphadenopathy.  Labs, Imaging and Diagnostic Testing:  Assessment and Plan:   Diagnoses and all orders for this visit:  Atypical ductal hyperplasia of right breast - [facility] Case Posting Request; Future - Ambulatory Referral to Oncology-Medical   The patient appears to have a 1.9 cm area of papilloma and atypical ductal hyperplasia in the lower inner quadrant of the right breast. Because atypical duct hyperplasia is considered a high risk lesion and can elevate her lifetime risk of breast cancer to around 30% and because it can have an appearance similar to preinvasive cancer my recommendation would be to have this area removed. She would also like to have this done. I have discussed with her in detail the risks and benefits of the operation as well as some of the technical aspects including use of a radioactive seed for localization  and she understands and wishes to proceed. We will also refer her to the high risk clinic at the cancer center to talk about risk reduction.

## 2024-08-19 NOTE — Anesthesia Preprocedure Evaluation (Addendum)
 Anesthesia Evaluation  Patient identified by MRN, date of birth, ID band Patient awake    Reviewed: Allergy & Precautions, NPO status , Patient's Chart, lab work & pertinent test results  Airway Mallampati: II  TM Distance: >3 FB Neck ROM: Full    Dental no notable dental hx.    Pulmonary former smoker   Pulmonary exam normal        Cardiovascular hypertension, Pt. on medications Normal cardiovascular exam     Neuro/Psych  PSYCHIATRIC DISORDERS Anxiety Depression    negative neurological ROS     GI/Hepatic negative GI ROS, Neg liver ROS,,,  Endo/Other  diabetes, Oral Hypoglycemic Agents    Renal/GU negative Renal ROS     Musculoskeletal negative musculoskeletal ROS (+)    Abdominal  (+) + obese  Peds  Hematology negative hematology ROS (+)   Anesthesia Other Findings RIGHT BREAST DCIS  Reproductive/Obstetrics                              Anesthesia Physical Anesthesia Plan  ASA: 3  Anesthesia Plan: General   Post-op Pain Management:    Induction: Intravenous  PONV Risk Score and Plan: 3 and Ondansetron , Dexamethasone , Midazolam  and Treatment may vary due to age or medical condition  Airway Management Planned: LMA  Additional Equipment:   Intra-op Plan:   Post-operative Plan: Extubation in OR  Informed Consent: I have reviewed the patients History and Physical, chart, labs and discussed the procedure including the risks, benefits and alternatives for the proposed anesthesia with the patient or authorized representative who has indicated his/her understanding and acceptance.     Dental advisory given  Plan Discussed with: CRNA  Anesthesia Plan Comments:          Anesthesia Quick Evaluation

## 2024-08-20 ENCOUNTER — Encounter (HOSPITAL_BASED_OUTPATIENT_CLINIC_OR_DEPARTMENT_OTHER): Payer: Self-pay | Admitting: General Surgery

## 2024-08-22 ENCOUNTER — Ambulatory Visit: Payer: Self-pay | Admitting: General Surgery

## 2024-08-22 LAB — SURGICAL PATHOLOGY

## 2024-08-24 NOTE — Progress Notes (Incomplete)
 Location of Breast Cancer:{:21944} ***  Histology per Pathology Report: ***  Receptor Status: ER(***), PR (***), Her2-neu (***), Ki-67(***)  Did patient present with symptoms (if so, please note symptoms) or was this found on screening mammography?: ***  Past/Anticipated interventions by surgeon, if any:{t:21944} ***  Past/Anticipated interventions by medical oncology, if any: Chemotherapy ***  Lymphedema issues, if any:  {:18581} {t:21944}   Pain issues, if any:  {:18581} {PAIN DESCRIPTION:21022940}  SAFETY ISSUES: Prior radiation? {:18581} Pacemaker/ICD? {:18581} Possible current pregnancy?{:18581} Is the patient on methotrexate? {:18581}  Current Complaints / other details:  ***

## 2024-08-25 ENCOUNTER — Encounter: Payer: Self-pay | Admitting: *Deleted

## 2024-08-25 ENCOUNTER — Ambulatory Visit
Admission: RE | Admit: 2024-08-25 | Discharge: 2024-08-25 | Disposition: A | Discharge status: Home / Self Care | Source: Ambulatory Visit | Attending: Radiation Oncology | Admitting: Radiation Oncology

## 2024-08-25 ENCOUNTER — Other Ambulatory Visit: Payer: Self-pay | Admitting: Hematology

## 2024-08-25 ENCOUNTER — Encounter: Payer: Self-pay | Admitting: Radiation Oncology

## 2024-08-25 VITALS — BP 141/87 | HR 80 | Temp 98.8°F | Resp 18 | Ht 63.0 in | Wt 206.2 lb

## 2024-08-25 DIAGNOSIS — Z7982 Long term (current) use of aspirin: Secondary | ICD-10-CM | POA: Insufficient documentation

## 2024-08-25 DIAGNOSIS — Z8052 Family history of malignant neoplasm of bladder: Secondary | ICD-10-CM | POA: Insufficient documentation

## 2024-08-25 DIAGNOSIS — E119 Type 2 diabetes mellitus without complications: Secondary | ICD-10-CM | POA: Diagnosis not present

## 2024-08-25 DIAGNOSIS — Z87891 Personal history of nicotine dependence: Secondary | ICD-10-CM | POA: Insufficient documentation

## 2024-08-25 DIAGNOSIS — I10 Essential (primary) hypertension: Secondary | ICD-10-CM | POA: Insufficient documentation

## 2024-08-25 DIAGNOSIS — D0511 Intraductal carcinoma in situ of right breast: Secondary | ICD-10-CM | POA: Insufficient documentation

## 2024-08-25 DIAGNOSIS — E785 Hyperlipidemia, unspecified: Secondary | ICD-10-CM | POA: Insufficient documentation

## 2024-08-25 DIAGNOSIS — Z801 Family history of malignant neoplasm of trachea, bronchus and lung: Secondary | ICD-10-CM | POA: Insufficient documentation

## 2024-08-25 DIAGNOSIS — Z170 Estrogen receptor positive status [ER+]: Secondary | ICD-10-CM | POA: Insufficient documentation

## 2024-08-25 DIAGNOSIS — Z7984 Long term (current) use of oral hypoglycemic drugs: Secondary | ICD-10-CM | POA: Diagnosis not present

## 2024-08-25 DIAGNOSIS — R7989 Other specified abnormal findings of blood chemistry: Secondary | ICD-10-CM | POA: Insufficient documentation

## 2024-08-25 DIAGNOSIS — Z79899 Other long term (current) drug therapy: Secondary | ICD-10-CM | POA: Diagnosis not present

## 2024-08-25 DIAGNOSIS — E559 Vitamin D deficiency, unspecified: Secondary | ICD-10-CM | POA: Insufficient documentation

## 2024-08-25 MED ORDER — TAMOXIFEN CITRATE 10 MG PO TABS: 5.0000 mg | ORAL_TABLET | Freq: Two times a day (BID) | ORAL | 1 refills | Status: DC

## 2024-08-26 ENCOUNTER — Telehealth: Payer: Self-pay

## 2024-08-26 NOTE — Telephone Encounter (Signed)
 LVM stating that Dr. Lanny was notified by Radiation Oncologist Dr. Estefana Cha that the pt wants to start Tamoxifen ; therefore, Dr. Lanny has prescribed Tamoxifen  and the prescription has been sent to the pt's preferred pharmacy on file.  Stated that Dr. Lanny would like for the pt to take half a pill (5mg  instead of 10mg ) daily.  Stated Dr. Lanny will keep the f/u appt as scheduled in January 2026 but instructed the pt to contact Dr. Demetra office should she have questions, concerns, or side effects from the Tamoxifen .

## 2024-09-01 ENCOUNTER — Encounter: Payer: Self-pay | Admitting: *Deleted

## 2024-09-01 DIAGNOSIS — D0511 Intraductal carcinoma in situ of right breast: Secondary | ICD-10-CM

## 2024-09-17 ENCOUNTER — Other Ambulatory Visit: Payer: Self-pay | Admitting: Hematology

## 2024-09-29 ENCOUNTER — Ambulatory Visit (HOSPITAL_BASED_OUTPATIENT_CLINIC_OR_DEPARTMENT_OTHER)
Admission: RE | Admit: 2024-09-29 | Discharge: 2024-09-29 | Disposition: A | Discharge status: Home / Self Care | Source: Ambulatory Visit | Attending: Family Medicine | Admitting: Family Medicine

## 2024-09-29 DIAGNOSIS — Z780 Asymptomatic menopausal state: Secondary | ICD-10-CM | POA: Insufficient documentation

## 2024-09-30 ENCOUNTER — Other Ambulatory Visit: Payer: 59

## 2024-10-07 ENCOUNTER — Telehealth: Payer: Self-pay | Admitting: Nurse Practitioner

## 2024-10-07 NOTE — Telephone Encounter (Signed)
 Jenny Maldonado called in and re-scheduled her appointment. She is now scheduled on 10/20 per her request.

## 2024-10-10 ENCOUNTER — Encounter: Admitting: Nurse Practitioner

## 2024-10-11 ENCOUNTER — Inpatient Hospital Stay: Admitting: Nurse Practitioner

## 2024-10-17 ENCOUNTER — Inpatient Hospital Stay: Attending: Hematology | Admitting: Nurse Practitioner

## 2024-10-17 ENCOUNTER — Encounter: Payer: Self-pay | Admitting: Nurse Practitioner

## 2024-10-17 DIAGNOSIS — C50911 Malignant neoplasm of unspecified site of right female breast: Secondary | ICD-10-CM | POA: Diagnosis not present

## 2024-10-17 DIAGNOSIS — Z853 Personal history of malignant neoplasm of breast: Secondary | ICD-10-CM | POA: Insufficient documentation

## 2024-10-17 MED ORDER — TAMOXIFEN CITRATE 10 MG PO TABS
5.0000 mg | ORAL_TABLET | Freq: Every day | ORAL | 1 refills | Status: AC
Start: 2024-10-17 — End: ?

## 2024-10-17 NOTE — Progress Notes (Signed)
 CLINIC:  Survivorship   Patient Care Team: Jenny Planas, MD as PCP - General (Family Medicine) Jenny Nanetta SAILOR, RN as Oncology Nurse Navigator Jenny Maldonado, Jenny HERO, RN as Oncology Nurse Navigator Jenny Callander, MD as Consulting Physician (Hematology) Jenny Deward MOULD, MD as Consulting Physician (General Surgery) Jenny Stagger, MD as Consulting Physician (Radiation Oncology) Jenny Lorio K, NP as Nurse Practitioner (Nurse Practitioner)   I connected with Jenny Maldonado on 10/17/24 at 10:00 AM EDT by telephone visit and verified that I am speaking with the correct person using two identifiers.   I discussed the limitations, risks, security and privacy concerns of performing an evaluation and management service by telemedicine and the availability of in-person appointments. I also discussed with the patient that there may be a patient responsible charge related to this service. The patient expressed understanding and agreed to proceed.   Other persons participating in the visit and their role in the encounter: none   Patient's location: home  Provider's location: [facility] office   REASON FOR VISIT: Routine follow-up post-treatment for a recent history of breast cancer.  BRIEF ONCOLOGIC HISTORY:  Oncology History  Ductal carcinoma in situ (DCIS) of right breast  06/24/2024 Initial Diagnosis   Ductal carcinoma in situ (DCIS) of right breast   08/05/2024 Cancer Staging   Staging form: Breast, AJCC 8th Edition - Clinical stage from 08/05/2024: Stage 0 (cTis (DCIS), cN0, cM0, G2, ER+, PR+, HER2: Not Assessed) - Signed by Jenny Callander, MD on 08/16/2024 Stage prefix: Initial diagnosis Histologic grading system: 3 grade system   Cancer of right breast (HCC)  08/05/2024 Cancer Staging   Staging form: Breast, AJCC 8th Edition - Pathologic stage from 08/05/2024: Stage Unknown (pTis (DCIS), pNX, cM0, G2, ER+, PR+, HER2: Not Assessed) - Signed by Jenny Mayme POUR, NP on 10/17/2024 Histologic grading system: 3 grade  system   10/17/2024 Initial Diagnosis   Cancer of right breast [facility])     INTERVAL HISTORY:  Jenny Maldonado presents to the Survivorship Clinic today for our initial meeting to review her survivorship care plan detailing her treatment course for breast cancer, as well as monitoring long-term side effects of that treatment, education regarding health maintenance, screening, and overall wellness and health promotion.     Overall, Jenny Maldonado is doing fine, incisions healed with some scar tissue and soreness but nothing concerning. Tolerating tamoxifen . She developed some abdominal cramps, Dr. Curvin recommended pelvic exam, she is working to establish with gyn. Denies bleeding or other side effects.     REVIEW OF SYSTEMS:  Review of Systems - Oncology Breast: Denies any new nodularity, masses, tenderness, nipple changes, or nipple discharge.      ONCOLOGY TREATMENT TEAM:  1. Surgeon:  Dr. Curvin at [facility] 2. Medical Oncologist: Dr. Lanny  3. Radiation Oncologist: Dr. Maritza    PAST MEDICAL/SURGICAL HISTORY:  Past Medical History:  Diagnosis Date   Anxiety    Bladder polyps    Depression    Diabetes mellitus without complication (HCC)    Elevated liver function tests    Hyperlipidemia    Hypertension    Vitamin D  deficiency    Past Surgical History:  Procedure Laterality Date   BREAST BIOPSY Right 05/06/2024   US  RT BREAST BX W LOC DEV 1ST LESION IMG BX SPEC US  GUIDE 05/06/2024 [facility]   BREAST BIOPSY  08/02/2024   MM RT RADIOACTIVE SEED LOC MAMMO GUIDE 08/02/2024 [facility] MAMMOGR[facility]   BREAST LUMPECTOMY WITH RADIOACTIVE SEED LOCALIZATION Right 08/05/2024  Procedure: BREAST LUMPECTOMY WITH RADIOACTIVE SEED LOCALIZATION;  Surgeon: Jenny Deward MOULD, MD;  Location: [facility];  Service: General;  Laterality: Right;   RE-EXCISION OF BREAST LUMPECTOMY Right 08/19/2024   Procedure: RE-EXCISION RIGHT BREAST MARGINS;  Surgeon: Jenny Deward MOULD, MD;  Location:  [facility];  Service: General;  Laterality: Right;  RE-EXCISION RIGHT BREAST MARGINS     ALLERGIES:  Allergies  Allergen Reactions   Other Other (See Comments)    BD ChloraPrep  Rash with blistering   Macrobid [Nitrofurantoin Monohyd Macro] Hives   Wound Dressing Adhesive Hives and Dermatitis    Bandaids cause irritation   Codeine Sulfate Other (See Comments)    Upset stomach.     CURRENT MEDICATIONS:  Outpatient Encounter Medications as of 10/17/2024  Medication Sig   aspirin 81 MG tablet Take 81 mg by mouth daily.   atorvastatin (LIPITOR) 20 MG tablet Take 20 mg by mouth daily.   cholecalciferol (VITAMIN D ) 1000 UNITS tablet Take 5,000 Units by mouth daily.    escitalopram (LEXAPRO) 10 MG tablet Take 10 mg by mouth daily.   glimepiride (AMARYL) 1 MG tablet Take 1 mg by mouth daily with breakfast.   glucose blood test strip 1 each by Other route daily. Use as instructed   hydrochlorothiazide (HYDRODIURIL) 25 MG tablet Take 25 mg by mouth daily.   lisinopril (PRINIVIL,ZESTRIL) 10 MG tablet Take 20 mg by mouth daily.    metFORMIN (GLUCOPHAGE) 500 MG tablet Take 500 mg by mouth 2 (two) times daily with a meal.   ONETOUCH DELICA LANCETS FINE MISC by Does not apply route daily.   tamoxifen  (NOLVADEX ) 10 MG tablet TAKE 0.5 TABLETS BY MOUTH 2 TIMES DAILY.   traMADol  (ULTRAM ) 50 MG tablet Take 1 tablet (50 mg total) by mouth every 6 (six) hours as needed.   No facility-administered encounter medications on file as of 10/17/2024.     ONCOLOGIC FAMILY HISTORY:  Family History  Problem Relation Age of Onset   Cancer Mother        lung cancer?   Cancer Father        bladder cancer and lung cancer     GENETIC COUNSELING/TESTING: N/A  SOCIAL HISTORY:  Jenny Maldonado alcohol or illicit drug use.  Quit smoking > 30 years ago   PHYSICAL EXAMINATION:  Vital Signs:  There were no vitals filed for this visit. There were no vitals filed for this  visit.    LABORATORY DATA:  None for this visit.  DIAGNOSTIC IMAGING:  None for this visit.      ASSESSMENT AND PLAN:  Ms.. Maldonado is a pleasant 68 y.o. female with Stage 0 right breast DCIS ER+/PR+, diagnosed in 07/2024 treated with lumpectomy and anti-estrogen therapy with tamoxifen  beginning in 09/2024.  She presents to the Survivorship Clinic for our initial meeting and routine follow-up post-completion of treatment for breast cancer.    #. Stage 0 DCIS:  Jenny Maldonado appears to have recovered well from definitive treatment for breast cancer. She will follow-up with her medical oncologist, Dr. Lanny in 12/2024 with history and physical exam per surveillance protocol.  She will continue her anti-estrogen therapy with low-dose tamoxifen . Thus far, she is tolerating well, with minimal side effects. She was instructed to make Dr. Lanny or myself aware if she begins to experience any worsening side effects of the medication and I could see her back in clinic to help manage those side effects, as needed. Though the incidence is  low, there is an associated risk of endometrial cancer with anti-estrogen therapies like Tamoxifen .  Jenny Maldonado was encouraged to contact Dr. Lanny or myself with any vaginal bleeding while taking Tamoxifen . She agrees to pelvic exam and will work on getting that done. Other side effects of Tamoxifen  were again reviewed with her as well. Today, a comprehensive survivorship care plan and treatment summary was reviewed with the patient today detailing her breast cancer diagnosis, treatment course, potential late/long-term effects of treatment, appropriate follow-up care with recommendations for the future, and patient education resources.  A copy of this summary, along with a letter will be sent to the patient's primary care provider via mail/fax/In Basket message after today's visit.    #. Bone health:  Given Jenny Maldonado's age and postmenopausal status that she is at risk for bone  demineralization.  Her last DEXA scan was normal in 09/2024.  She takes calcium and vitamin D .  I encouraged her to continue weightbearing exercise.  We reviewed the bone strengthening qualities of tamoxifen .  She was given education on specific activities to promote bone health.  #. Cancer screening:  Due to Jenny Maldonado's history and her age, she should receive screening for skin cancers and colon cancer.  She is s/p hysterectomy.  The information and recommendations are listed on the patient's comprehensive care plan/treatment summary and were reviewed in detail with the patient.    #. Health maintenance and wellness promotion: Jenny Maldonado was encouraged to consume 5-7 servings of fruits and vegetables per day. She was also encouraged to engage in moderate to vigorous exercise for 30 minutes per day most days of the week. She was instructed to limit her alcohol consumption and continue to abstain from tobacco use.     #. Support services/counseling: It is not uncommon for this period of the patient's cancer care trajectory to be one of many emotions and stressors.  We discussed an opportunity for her to participate in the next session of FYNN (Finding Your New Normal) support group series designed for patients after they have completed treatment.   Jenny Maldonado was encouraged to take advantage of our many other support services programs, support groups, and/or counseling in coping with her new life as a cancer survivor after completing anti-cancer treatment.  She was offered support today through active listening and expressive supportive counseling.  She was given information regarding our available services and encouraged to contact me with any questions or for help enrolling in any of our support group/programs.    Dispo:   -Return to cancer center 12/2024 as scheduled  -Mammogram due in 03/2025 -Follow up with surgery as indicated  -Establish with Gyn for pelvic exam while on tamoxifen    She is  welcome to return back to the Survivorship Clinic at any time; no additional follow-up needed at this time. Consider referral back to survivorship as a long-term survivor for continued surveillance  A total of 17 minutes of non-face-to-face time was spent with this patient with greater than 50% of that time in counseling and care-coordination.   Jawanna Dykman, NP Survivorship Program [facility] [phone]   Note: PRIMARY CARE PROVIDER Jenny Planas, MD [phone]

## 2025-01-06 ENCOUNTER — Other Ambulatory Visit

## 2025-01-06 ENCOUNTER — Ambulatory Visit: Admitting: Hematology

## 2025-01-11 ENCOUNTER — Inpatient Hospital Stay: Admitting: Hematology

## 2025-01-11 ENCOUNTER — Inpatient Hospital Stay: Attending: Hematology

## 2025-01-13 ENCOUNTER — Inpatient Hospital Stay: Attending: Hematology

## 2025-01-13 ENCOUNTER — Inpatient Hospital Stay: Admitting: Hematology

## 2025-01-13 VITALS — BP 136/78 | HR 96 | Temp 97.8°F | Resp 18 | Ht 63.0 in | Wt 210.3 lb

## 2025-01-13 DIAGNOSIS — D0511 Intraductal carcinoma in situ of right breast: Secondary | ICD-10-CM | POA: Diagnosis not present

## 2025-01-13 DIAGNOSIS — N6091 Unspecified benign mammary dysplasia of right breast: Secondary | ICD-10-CM

## 2025-01-13 LAB — CMP (CANCER CENTER ONLY)
ALT: 42 U/L (ref 0–44)
AST: 27 U/L (ref 15–41)
Albumin: 4.4 g/dL (ref 3.5–5.0)
Alkaline Phosphatase: 50 U/L (ref 38–126)
Anion gap: 14 (ref 5–15)
BUN: 12 mg/dL (ref 8–23)
CO2: 24 mmol/L (ref 22–32)
Calcium: 9.5 mg/dL (ref 8.9–10.3)
Chloride: 102 mmol/L (ref 98–111)
Creatinine: 0.74 mg/dL (ref 0.44–1.00)
GFR, Estimated: >60 mL/min
Glucose, Bld: 166 mg/dL — ABNORMAL HIGH (ref 70–99)
Potassium: 4.5 mmol/L (ref 3.5–5.1)
Sodium: 140 mmol/L (ref 135–145)
Total Bilirubin: 0.5 mg/dL (ref 0.0–1.2)
Total Protein: 7.1 g/dL (ref 6.5–8.1)

## 2025-01-13 LAB — CBC WITH DIFFERENTIAL (CANCER CENTER ONLY)
Abs Immature Granulocytes: 0.02 K/uL (ref 0.00–0.07)
Basophils Absolute: 0.1 K/uL (ref 0.0–0.1)
Basophils Relative: 1 %
Eosinophils Absolute: 0.2 K/uL (ref 0.0–0.5)
Eosinophils Relative: 2 %
HCT: 38.7 % (ref 36.0–46.0)
Hemoglobin: 13.3 g/dL (ref 12.0–15.0)
Immature Granulocytes: 0 %
Lymphocytes Relative: 26 %
Lymphs Abs: 2.3 K/uL (ref 0.7–4.0)
MCH: 30.2 pg (ref 26.0–34.0)
MCHC: 34.4 g/dL (ref 30.0–36.0)
MCV: 88 fL (ref 80.0–100.0)
Monocytes Absolute: 0.5 K/uL (ref 0.1–1.0)
Monocytes Relative: 6 %
Neutro Abs: 5.7 K/uL (ref 1.7–7.7)
Neutrophils Relative %: 65 %
Platelet Count: 255 K/uL (ref 150–400)
RBC: 4.4 MIL/uL (ref 3.87–5.11)
RDW: 12.4 % (ref 11.5–15.5)
WBC Count: 8.7 K/uL (ref 4.0–10.5)
nRBC: 0 % (ref 0.0–0.2)

## 2025-01-13 NOTE — Progress Notes (Signed)
 " [facility]   [phone] [phone]   Clinic Follow up Note   Patient Care Team: Cleotilde Planas, MD as PCP - General (Family Medicine) Tyree Nanetta SAILOR, RN as Oncology Nurse Navigator Gerome, Devere HERO, RN as Oncology Nurse Navigator Lanny Callander, MD as Consulting Physician (Hematology) Curvin Deward MOULD, MD as Consulting Physician (General Surgery) Maritza Stagger, MD as Consulting Physician (Radiation Oncology) Burton, Lacie K, NP as Nurse Practitioner (Nurse Practitioner)  Date of Service:  01/13/2025  CHIEF COMPLAINT: f/u of breast cancer  CURRENT THERAPY:  Tamoxifen  5 mg daily  Oncology History   Ductal carcinoma in situ (DCIS) of right breast -G2, ER+/PR+ - Screening mammogram in April 2025 showed 1.9 cm calcification in the right breast 3 o'clock position, biopsy showed ADH. -She underwent lumpectomy on August 05, 2024, which showed intermediate grade DCIS, ER and PR strongly positive, margin was positive.  She underwent second surgery on August 19, 2024 - Patient opted tamoxifen  over adjuvant radiation, she started o in 08/2024  Assessment & Plan Ductal carcinoma in situ of right breast Ductal carcinoma in situ of the right breast, status post two breast surgeries. She remains on tamoxifen  and under routine surveillance. No evidence of recurrence on recent examination. Persistent breast pain is likely secondary to scar tissue. Breast tissue is heterogeneously dense (density C). - Performed breast examination in clinic. - Ordered diagnostic mammogram for April at the breast center. - Discussed breast MRI as an alternative if mammography is intolerable due to pain. - Advised pre-mammogram ibuprofen for discomfort. - Recommended consideration of alternating annual mammogram and MRI for intensified surveillance if desired.  Adverse effects of tamoxifen  therapy On tamoxifen  with occasional mild abdominal cramps, very light vaginal spotting, and  minimal hot flashes. Symptoms are not concerning at this time. Counseled regarding risk of tamoxifen -induced endometrial changes and small risk of endometrial carcinoma, particularly at full dose. She is postmenopausal and aware to monitor for significant vaginal bleeding. - Counseled to monitor for significant vaginal bleeding and seek gynecologic evaluation if it occurs. - Reassured regarding current mild symptoms and continued tamoxifen  therapy. - Confirmed no need for tamoxifen  refill at this time; advised to contact pharmacy if needed.  Plan - She is tolerating tamoxifen  well, will continue - Diagnostic mammogram for April 2026 - Plan to follow-up in 6 months, will change to annual visit after next. - She is interested in annual breast MRI for cancer screening also.   SUMMARY OF ONCOLOGIC HISTORY: Oncology History  Ductal carcinoma in situ (DCIS) of right breast  06/24/2024 Initial Diagnosis   Ductal carcinoma in situ (DCIS) of right breast   08/05/2024 Cancer Staging   Staging form: Breast, AJCC 8th Edition - Clinical stage from 08/05/2024: Stage 0 (cTis (DCIS), cN0, cM0, G2, ER+, PR+, HER2: Not Assessed) - Signed by Lanny Callander, MD on 08/16/2024 Stage prefix: Initial diagnosis Histologic grading system: 3 grade system   Cancer of right breast (HCC)  08/05/2024 Cancer Staging   Staging form: Breast, AJCC 8th Edition - Pathologic stage from 08/05/2024: Stage Unknown (pTis (DCIS), pNX, cM0, G2, ER+, PR+, HER2: Not Assessed) - Signed by Ann Mayme POUR, NP on 10/17/2024 Histologic grading system: 3 grade system   10/17/2024 Initial Diagnosis   Cancer of right breast [facility])      Discussed the use of AI scribe software for clinical note transcription with the patient, who gave verbal consent to proceed.  History of Present Illness Jenny Maldonado is a 69 year old female  with ductal carcinoma in situ of the right breast, status post tumor resection, who presents for routine oncology follow-up  and surveillance.  She takes tamoxifen  20 mg daily without significant adverse effects. She has occasional mild lower abdominal cramping similar to menstrual cramps and a few episodes of very light vaginal spotting, which she does not find concerning. Menopause occurred at age 50. She has minimal, tolerable hot flashes and no other tamoxifen -related symptoms.  She had two prior breast surgeries with tumor removal during the second. Her last mammogram was in April 2025. She has intermittent right breast pain, worsened by certain bras and attributed to scar tissue and dense breast tissue (density C). Mammograms are painful, as they were before her cancer diagnosis. She uses Advil as needed for discomfort. She recalls prior discussion of alternating annual breast MRI with mammograms and is open to MRI if mammogram pain becomes intolerable.  She was recently started on Mounjaro for elevated hemoglobin A1c of 7.7 by her primary care provider and has completed two injections, with the third due today. She has not yet noticed changes in weight or blood glucose. She follows with her primary care provider every six months for medication management. No other new symptoms or concerns were noted relevant to her cancer care today.     All other systems were reviewed with the patient and are negative.  MEDICAL HISTORY:  Past Medical History:  Diagnosis Date   Anxiety    Bladder polyps    Depression    Diabetes mellitus without complication (HCC)    Elevated liver function tests    Hyperlipidemia    Hypertension    Vitamin D  deficiency     SURGICAL HISTORY: Past Surgical History:  Procedure Laterality Date   BREAST BIOPSY Right 05/06/2024   US  RT BREAST BX W LOC DEV 1ST LESION IMG BX SPEC US  GUIDE 05/06/2024 [facility] MAMMOGRAPHY   BREAST BIOPSY  08/02/2024   MM RT RADIOACTIVE SEED LOC MAMMO GUIDE 08/02/2024 [facility] MAMMOGRAPHY   BREAST LUMPECTOMY WITH RADIOACTIVE SEED LOCALIZATION Right 08/05/2024   Procedure:  BREAST LUMPECTOMY WITH RADIOACTIVE SEED LOCALIZATION;  Surgeon: Curvin Deward MOULD, MD;  Location: [facility];  Service: General;  Laterality: Right;   RE-EXCISION OF BREAST LUMPECTOMY Right 08/19/2024   Procedure: RE-EXCISION RIGHT BREAST MARGINS;  Surgeon: Curvin Deward MOULD, MD;  Location: [facility];  Service: General;  Laterality: Right;  RE-EXCISION RIGHT BREAST MARGINS    I have reviewed the social history and family history with the patient and they are unchanged from previous note.  ALLERGIES:  is allergic to other, macrobid [nitrofurantoin monohyd macro], wound dressing adhesive, and codeine sulfate.  MEDICATIONS:  Current Outpatient Medications  Medication Sig Dispense Refill   aspirin 81 MG tablet Take 81 mg by mouth daily.     atorvastatin (LIPITOR) 20 MG tablet Take 20 mg by mouth daily.     cholecalciferol (VITAMIN D ) 1000 UNITS tablet Take 5,000 Units by mouth daily.      escitalopram (LEXAPRO) 10 MG tablet Take 10 mg by mouth daily.     glimepiride (AMARYL) 1 MG tablet Take 1 mg by mouth daily with breakfast.     glucose blood test strip 1 each by Other route daily. Use as instructed     hydrochlorothiazide (HYDRODIURIL) 25 MG tablet Take 25 mg by mouth daily.     lisinopril (PRINIVIL,ZESTRIL) 10 MG tablet Take 20 mg by mouth daily.      metFORMIN (GLUCOPHAGE) 500 MG  tablet Take 500 mg by mouth 2 (two) times daily with a meal.     MOUNJARO 2.5 MG/0.5ML Pen SMARTSIG:1 injection SUB-Q Once a Week     ONETOUCH DELICA LANCETS FINE MISC by Does not apply route daily.     tamoxifen  (NOLVADEX ) 10 MG tablet Take 0.5 tablets (5 mg total) by mouth daily. 90 tablet 1   No current facility-administered medications for this visit.    PHYSICAL EXAMINATION: ECOG PERFORMANCE STATUS: 0 - Asymptomatic  Vitals:   01/13/25 1023  BP: 136/78  Pulse: 96  Resp: 18  Temp: 97.8 F (36.6 C)  SpO2: 96%   Wt Readings from Last 3 Encounters:  01/13/25 210 lb 4.8 oz  (95.4 kg)  08/25/24 206 lb 3.2 oz (93.5 kg)  08/19/24 207 lb 10.8 oz (94.2 kg)     GENERAL:alert, no distress and comfortable SKIN: skin color, texture, turgor are normal, no rashes or significant lesions EYES: normal, Conjunctiva are pink and non-injected, sclera clear NECK: supple, thyroid normal size, non-tender, without nodularity LYMPH:  no palpable lymphadenopathy in the cervical, axillary  LUNGS: clear to auscultation and percussion with normal breathing effort HEART: regular rate & rhythm and no murmurs and no lower extremity edema ABDOMEN:abdomen soft, non-tender and normal bowel sounds Musculoskeletal:no cyanosis of digits and no clubbing  NEURO: alert & oriented x 3 with fluent speech, no focal motor/sensory deficits Breasts: Breast inspection showed them to be symmetrical with no nipple discharge. Palpation of the breasts and axilla revealed no obvious mass that I could appreciate.  Physical Exam   LABORATORY DATA:  I have reviewed the data as listed    Latest Ref Rng & Units 01/13/2025   10:10 AM 08/16/2024    8:43 AM  CBC  WBC 4.0 - 10.5 K/uL 8.7  9.2   Hemoglobin 12.0 - 15.0 g/dL 86.6  86.7   Hematocrit 36.0 - 46.0 % 38.7  39.5   Platelets 150 - 400 K/uL 255  273         Latest Ref Rng & Units 01/13/2025   10:10 AM 08/16/2024    8:43 AM 08/02/2024   11:00 AM  CMP  Glucose 70 - 99 mg/dL 833  825  830   BUN 8 - 23 mg/dL 12  14  15    Creatinine 0.44 - 1.00 mg/dL 9.25  9.37  9.36   Sodium 135 - 145 mmol/L 140  139  140   Potassium 3.5 - 5.1 mmol/L 4.5  4.2  4.6   Chloride 98 - 111 mmol/L 102  103  101   CO2 22 - 32 mmol/L 24  27  27    Calcium 8.9 - 10.3 mg/dL 9.5  9.1  9.3   Total Protein 6.5 - 8.1 g/dL 7.1  7.0    Total Bilirubin 0.0 - 1.2 mg/dL 0.5  0.5    Alkaline Phos 38 - 126 U/L 50  50    AST 15 - 41 U/L 27  16    ALT 0 - 44 U/L 42  29        RADIOGRAPHIC STUDIES: I have personally reviewed the radiological images as listed and agreed with the  findings in the report. No results found.    Orders Placed This Encounter  Procedures   MM 3D DIAGNOSTIC MAMMOGRAM BILATERAL BREAST    PWD:[id number] PREV:04-01-24 [facility]  YES SURGERY/ NO IMPLANTS OR REDUCTION/ YES BREAST CANCER// NO NEEDS EG SW PT  PT AWARE $75 NO SHOW/CANCELLATION FEE WITHIN 24  HOURS    Standing Status:   Future    Expected Date:   04/17/2025    Expiration Date:   01/13/2026    Reason for Exam (SYMPTOM  OR DIAGNOSIS REQUIRED):   screening    [facility]erred [facility]?:   [facility]   All questions were answered. The patient knows to call the clinic with any problems, questions or concerns. No barriers to learning was detected. The total time spent in the appointment was 25 minutes, including review of chart and various tests results, discussions about plan of care and coordination of care plan     Onita Mattock, MD 01/13/2025     "

## 2025-01-13 NOTE — Assessment & Plan Note (Signed)
-  G2, ER+/PR+ - Screening mammogram in April 2025 showed 1.9 cm calcification in the right breast 3 o'clock position, biopsy showed ADH. -She underwent lumpectomy on August 05, 2024, which showed intermediate grade DCIS, ER and PR strongly positive, margin was positive.  She underwent second surgery on August 19, 2024 - Patient opted tamoxifen  over adjuvant radiation, she started o in 08/2024

## 2025-04-14 ENCOUNTER — Encounter

## 2025-07-14 ENCOUNTER — Inpatient Hospital Stay: Admitting: Nurse Practitioner

## 2025-07-14 ENCOUNTER — Inpatient Hospital Stay
